# Patient Record
Sex: Male | Born: 1950
Health system: Southern US, Community
[De-identification: ages and names within clinical notes are randomized; demographics above are authoritative.]

## PROBLEM LIST (undated history)

## (undated) DIAGNOSIS — M1990 Unspecified osteoarthritis, unspecified site: Secondary | ICD-10-CM

## (undated) DIAGNOSIS — J40 Bronchitis, not specified as acute or chronic: Secondary | ICD-10-CM

## (undated) DIAGNOSIS — K219 Gastro-esophageal reflux disease without esophagitis: Secondary | ICD-10-CM

## (undated) DIAGNOSIS — E1169 Type 2 diabetes mellitus with other specified complication: Secondary | ICD-10-CM

## (undated) DIAGNOSIS — Z87442 Personal history of urinary calculi: Secondary | ICD-10-CM

## (undated) DIAGNOSIS — Z6828 Body mass index (BMI) 28.0-28.9, adult: Secondary | ICD-10-CM

## (undated) DIAGNOSIS — M14679 Charcot's joint, unspecified ankle and foot: Secondary | ICD-10-CM

## (undated) DIAGNOSIS — E11621 Type 2 diabetes mellitus with foot ulcer: Secondary | ICD-10-CM

## (undated) DIAGNOSIS — E119 Type 2 diabetes mellitus without complications: Secondary | ICD-10-CM

## (undated) DIAGNOSIS — J329 Chronic sinusitis, unspecified: Secondary | ICD-10-CM

## (undated) DIAGNOSIS — R000 Tachycardia, unspecified: Secondary | ICD-10-CM

## (undated) DIAGNOSIS — J45909 Unspecified asthma, uncomplicated: Secondary | ICD-10-CM

## (undated) HISTORY — DX: Unspecified asthma, uncomplicated: J45.909

## (undated) HISTORY — DX: Type 2 diabetes mellitus with foot ulcer: E11.621

## (undated) HISTORY — DX: Body mass index (BMI) 28.0-28.9, adult: Z68.28

## (undated) HISTORY — DX: Chronic sinusitis, unspecified: J32.9

## (undated) HISTORY — DX: Personal history of urinary calculi: Z87.442

## (undated) HISTORY — DX: Type 2 diabetes mellitus with other specified complication: E11.69

## (undated) HISTORY — DX: Tachycardia, unspecified: R00.0

## (undated) HISTORY — DX: Gastro-esophageal reflux disease without esophagitis: K21.9

## (undated) HISTORY — PX: SINOSCOPY: SHX187

---
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
## 1981-10-12 HISTORY — PX: CERVICAL DISC SURGERY: SHX588

## 2006-12-16 ENCOUNTER — Encounter: Admission: RE | Admit: 2006-12-16 | Discharge: 2006-12-16 | Payer: Self-pay | Admitting: Unknown Physician Specialty

## 2014-08-12 HISTORY — PX: FINGER NAIL SURGERY: SHX717

## 2014-11-12 DIAGNOSIS — M14679 Charcot's joint, unspecified ankle and foot: Secondary | ICD-10-CM | POA: Insufficient documentation

## 2014-11-12 HISTORY — DX: Charcot's joint, unspecified ankle and foot: M14.679

## 2015-07-23 ENCOUNTER — Other Ambulatory Visit: Payer: Self-pay | Admitting: Orthopedic Surgery

## 2015-08-16 DIAGNOSIS — M25562 Pain in left knee: Secondary | ICD-10-CM

## 2015-08-16 DIAGNOSIS — M1712 Unilateral primary osteoarthritis, left knee: Secondary | ICD-10-CM

## 2015-08-16 DIAGNOSIS — G8929 Other chronic pain: Secondary | ICD-10-CM

## 2015-08-16 HISTORY — DX: Pain in left knee: M25.562

## 2015-08-16 HISTORY — DX: Unilateral primary osteoarthritis, left knee: M17.12

## 2015-08-16 HISTORY — DX: Other chronic pain: G89.29

## 2015-08-16 NOTE — Pre-Procedure Instructions (Signed)
John Miranda  08/16/2015      [id number] - [place], [place] - [address] [place] [address] Phone: [phone] Fax: [phone]    Your procedure is scheduled on Mon, Nov 21 @ 7:30 AM  Report to [facility] Admitting at 5:30 AM  Call this number if you have problems the morning of surgery:  [phone]   Remember:  Do not eat food or drink liquids after midnight.             The morning of surgery with a sip of water take: no medications                  How to Manage Your Diabetes Before Surgery   Why is it important to control my blood sugar before and after surgery?   Improving blood sugar levels before and after surgery helps healing and can limit problems.  A way of improving blood sugar control is eating a healthy diet by:  - Eating less sugar and carbohydrates  - Increasing activity/exercise  - Talk with your doctor about reaching your blood sugar goals  High blood sugars (greater than 180 mg/dL) can raise your risk of infections and slow down your recovery so you will need to focus on controlling your diabetes during the weeks before surgery.  Make sure that the doctor who takes care of your diabetes knows about your planned surgery including the date and location.  How do I manage my blood sugars before surgery?   Check your blood sugar at least 4 times a day, 2 days before surgery to make sure that they are not too high or low.   Check your blood sugar the morning of your surgery when you wake up and every 2               hours until you get to the Short-Stay unit.  If your blood sugar is less than 70 mg/dL, you will need to treat for low blood sugar by:  Treat a low blood sugar (less than 70 mg/dL) with 1/2 cup of clear juice (cranberry or apple), 4 glucose tablets, OR glucose gel.  Recheck blood sugar in 15 minutes after treatment (to make sure it is greater than 70 mg/dL).  If blood sugar  is not greater than 70 mg/dL on re-check, call [phone] for further instructions.   Report your blood sugar to the Short-Stay nurse when you get to Short-Stay.  References:  [facility], 2007 "How to Manage your Diabetes Before and After Surgery".  What do I do about my diabetes medications?   Do not take oral diabetes medicines (pills) the morning of surgery.                No Goody's,BC's,Aleve,Aspirin,Ibuprofen,Motrin,Advil,Fish Oil,or any Herbal Medications.    Do not wear jewelry.  Do not wear lotions, powders, or colognes.  You may wear deodorant.             Men may shave face and neck.  Do not bring valuables to the hospital.  [facility] is not responsible for any belongings or valuables.  Contacts, dentures or bridgework may not be worn into surgery.  Leave your suitcase in the car.  After surgery it may be brought to your room.  For patients admitted to the hospital, discharge time will be determined by your treatment team.  Patients discharged the  day of surgery will not be allowed to drive home.    Special instructions:    Please read over the following fact sheets that you were given. Pain Booklet, Coughing and Deep Breathing, MRSA Information and Surgical Site Infection Prevention

## 2015-08-19 ENCOUNTER — Ambulatory Visit (HOSPITAL_COMMUNITY)
Admission: RE | Admit: 2015-08-19 | Discharge: 2015-08-19 | Disposition: A | Discharge status: Home / Self Care | Payer: BLUE CROSS/BLUE SHIELD | Source: Ambulatory Visit | Attending: Orthopedic Surgery | Admitting: Orthopedic Surgery

## 2015-08-19 ENCOUNTER — Encounter (HOSPITAL_COMMUNITY): Payer: Self-pay

## 2015-08-19 ENCOUNTER — Encounter (HOSPITAL_COMMUNITY)
Admission: RE | Admit: 2015-08-19 | Discharge: 2015-08-19 | Disposition: A | Discharge status: Home / Self Care | Payer: BLUE CROSS/BLUE SHIELD | Source: Ambulatory Visit | Attending: Orthopedic Surgery | Admitting: Orthopedic Surgery

## 2015-08-19 DIAGNOSIS — Z01818 Encounter for other preprocedural examination: Secondary | ICD-10-CM

## 2015-08-19 DIAGNOSIS — Z01812 Encounter for preprocedural laboratory examination: Secondary | ICD-10-CM | POA: Insufficient documentation

## 2015-08-19 DIAGNOSIS — E119 Type 2 diabetes mellitus without complications: Secondary | ICD-10-CM | POA: Insufficient documentation

## 2015-08-19 DIAGNOSIS — R918 Other nonspecific abnormal finding of lung field: Secondary | ICD-10-CM | POA: Diagnosis not present

## 2015-08-19 HISTORY — DX: Unspecified osteoarthritis, unspecified site: M19.90

## 2015-08-19 HISTORY — DX: Type 2 diabetes mellitus without complications: E11.9

## 2015-08-19 HISTORY — DX: Charcot's joint, unspecified ankle and foot: M14.679

## 2015-08-19 LAB — CBC WITH DIFFERENTIAL/PLATELET
BASOS ABS: 0 10*3/uL (ref 0.0–0.1)
BASOS PCT: 0 %
EOS ABS: 0.2 10*3/uL (ref 0.0–0.7)
Eosinophils Relative: 2 %
HCT: 46.6 % (ref 39.0–52.0)
Hemoglobin: 15.7 g/dL (ref 13.0–17.0)
Lymphocytes Relative: 18 %
Lymphs Abs: 1.6 10*3/uL (ref 0.7–4.0)
MCH: 31.8 pg (ref 26.0–34.0)
MCHC: 33.7 g/dL (ref 30.0–36.0)
MCV: 94.5 fL (ref 78.0–100.0)
MONO ABS: 0.6 10*3/uL (ref 0.1–1.0)
MONOS PCT: 7 %
NEUTROS PCT: 73 %
Neutro Abs: 6.2 10*3/uL (ref 1.7–7.7)
Platelets: 199 10*3/uL (ref 150–400)
RBC: 4.93 MIL/uL (ref 4.22–5.81)
RDW: 14.1 % (ref 11.5–15.5)
WBC: 8.6 10*3/uL (ref 4.0–10.5)

## 2015-08-19 LAB — URINALYSIS, ROUTINE W REFLEX MICROSCOPIC
Bilirubin Urine: NEGATIVE
Glucose, UA: NEGATIVE mg/dL
HGB URINE DIPSTICK: NEGATIVE
Ketones, ur: NEGATIVE mg/dL
LEUKOCYTES UA: NEGATIVE
NITRITE: NEGATIVE
PROTEIN: NEGATIVE mg/dL
Specific Gravity, Urine: 1.019 (ref 1.005–1.030)
UROBILINOGEN UA: 0.2 mg/dL (ref 0.0–1.0)
pH: 5 (ref 5.0–8.0)

## 2015-08-19 LAB — COMPREHENSIVE METABOLIC PANEL
ALBUMIN: 4.3 g/dL (ref 3.5–5.0)
ALT: 22 U/L (ref 17–63)
ANION GAP: 8 (ref 5–15)
AST: 21 U/L (ref 15–41)
Alkaline Phosphatase: 65 U/L (ref 38–126)
BUN: 18 mg/dL (ref 6–20)
CHLORIDE: 104 mmol/L (ref 101–111)
CO2: 28 mmol/L (ref 22–32)
Calcium: 9.8 mg/dL (ref 8.9–10.3)
Creatinine, Ser: 0.83 mg/dL (ref 0.61–1.24)
GFR calc Af Amer: >60 mL/min (ref >60)
GFR calc non Af Amer: >60 mL/min (ref >60)
GLUCOSE: 134 mg/dL — AB (ref 65–99)
POTASSIUM: 4.2 mmol/L (ref 3.5–5.1)
SODIUM: 140 mmol/L (ref 135–145)
Total Bilirubin: 0.7 mg/dL (ref 0.3–1.2)
Total Protein: 7.9 g/dL (ref 6.5–8.1)

## 2015-08-19 LAB — SURGICAL PCR SCREEN
MRSA, PCR: NEGATIVE
STAPHYLOCOCCUS AUREUS: NEGATIVE

## 2015-08-19 LAB — APTT: APTT: 31 s (ref 24–37)

## 2015-08-19 LAB — PROTIME-INR
INR: 1.19 (ref 0.00–1.49)
Prothrombin Time: 15.2 seconds (ref 11.6–15.2)

## 2015-08-19 LAB — GLUCOSE, CAPILLARY: Glucose-Capillary: 134 mg/dL — ABNORMAL HIGH (ref 65–99)

## 2015-08-19 NOTE — Progress Notes (Signed)
PCP: Dr. Humphrey Rolls: [facility], will request hgb A1C Denies cardiologist [facility]: ekg 2016-will request  Pt. On enalipril to protect kidneys, denies high blood pressure

## 2015-08-20 ENCOUNTER — Other Ambulatory Visit (HOSPITAL_COMMUNITY): Payer: Self-pay

## 2015-08-20 LAB — URINE CULTURE: Culture: NO GROWTH

## 2015-08-30 MED ORDER — CEFAZOLIN SODIUM-DEXTROSE 2-3 GM-% IV SOLR
2.0000 g | INTRAVENOUS | Status: AC
  Administered 2015-09-02: 2 g via INTRAVENOUS
  Filled 2015-08-30: qty 50

## 2015-08-30 MED ORDER — CHLORHEXIDINE GLUCONATE 4 % EX LIQD: 60.0000 mL | Freq: Once | CUTANEOUS | Status: DC

## 2015-08-30 MED ORDER — TRANEXAMIC ACID 1000 MG/10ML IV SOLN
1000.0000 mg | INTRAVENOUS | Status: AC
  Administered 2015-09-02: 1000 mg via INTRAVENOUS
  Filled 2015-08-30: qty 10

## 2015-08-30 MED ORDER — SODIUM CHLORIDE 0.9 % IV SOLN: INTRAVENOUS | Status: DC

## 2015-08-30 MED ORDER — BUPIVACAINE LIPOSOME 1.3 % IJ SUSP
20.0000 mL | Freq: Once | INTRAMUSCULAR | Status: AC
  Administered 2015-09-02: 20 mL
  Filled 2015-08-30: qty 20

## 2015-09-02 ENCOUNTER — Inpatient Hospital Stay (HOSPITAL_COMMUNITY)
Admission: RE | Admit: 2015-09-02 | Discharge: 2015-09-03 | DRG: 470 | Disposition: A | Discharge status: Home Health | Payer: BLUE CROSS/BLUE SHIELD | Source: Ambulatory Visit | Attending: Orthopedic Surgery | Admitting: Orthopedic Surgery

## 2015-09-02 ENCOUNTER — Inpatient Hospital Stay (HOSPITAL_COMMUNITY): Payer: BLUE CROSS/BLUE SHIELD | Admitting: Anesthesiology

## 2015-09-02 ENCOUNTER — Encounter (HOSPITAL_COMMUNITY): Payer: Self-pay | Admitting: *Deleted

## 2015-09-02 ENCOUNTER — Encounter (HOSPITAL_COMMUNITY)
Admission: RE | Disposition: A | Discharge status: Home Health | Payer: Self-pay | Source: Ambulatory Visit | Attending: Orthopedic Surgery

## 2015-09-02 DIAGNOSIS — M1712 Unilateral primary osteoarthritis, left knee: Principal | ICD-10-CM | POA: Diagnosis present

## 2015-09-02 DIAGNOSIS — Z96652 Presence of left artificial knee joint: Secondary | ICD-10-CM

## 2015-09-02 DIAGNOSIS — Z7984 Long term (current) use of oral hypoglycemic drugs: Secondary | ICD-10-CM | POA: Diagnosis not present

## 2015-09-02 DIAGNOSIS — E11610 Type 2 diabetes mellitus with diabetic neuropathic arthropathy: Secondary | ICD-10-CM | POA: Diagnosis present

## 2015-09-02 DIAGNOSIS — D62 Acute posthemorrhagic anemia: Secondary | ICD-10-CM | POA: Diagnosis not present

## 2015-09-02 DIAGNOSIS — Z79899 Other long term (current) drug therapy: Secondary | ICD-10-CM

## 2015-09-02 DIAGNOSIS — Z96659 Presence of unspecified artificial knee joint: Secondary | ICD-10-CM

## 2015-09-02 DIAGNOSIS — M25562 Pain in left knee: Secondary | ICD-10-CM | POA: Diagnosis present

## 2015-09-02 HISTORY — PX: TOTAL KNEE ARTHROPLASTY: SHX125

## 2015-09-02 HISTORY — DX: Presence of left artificial knee joint: Z96.652

## 2015-09-02 LAB — CBC
HEMATOCRIT: 39.5 % (ref 39.0–52.0)
Hemoglobin: 13.4 g/dL (ref 13.0–17.0)
MCH: 32.1 pg (ref 26.0–34.0)
MCHC: 33.9 g/dL (ref 30.0–36.0)
MCV: 94.5 fL (ref 78.0–100.0)
Platelets: 184 10*3/uL (ref 150–400)
RBC: 4.18 MIL/uL — AB (ref 4.22–5.81)
RDW: 13.9 % (ref 11.5–15.5)
WBC: 13.4 10*3/uL — AB (ref 4.0–10.5)

## 2015-09-02 LAB — GLUCOSE, CAPILLARY
GLUCOSE-CAPILLARY: 95 mg/dL (ref 65–99)
GLUCOSE-CAPILLARY: 162 mg/dL — AB (ref 65–99)
Glucose-Capillary: 116 mg/dL — ABNORMAL HIGH (ref 65–99)
Glucose-Capillary: 171 mg/dL — ABNORMAL HIGH (ref 65–99)

## 2015-09-02 LAB — CREATININE, SERUM: Creatinine, Ser: 0.94 mg/dL (ref 0.61–1.24)

## 2015-09-02 SURGERY — ARTHROPLASTY, KNEE, TOTAL
Anesthesia: Monitor Anesthesia Care | Site: Knee | Laterality: Left

## 2015-09-02 MED ORDER — GLYCOPYRROLATE 0.2 MG/ML IJ SOLN
INTRAMUSCULAR | Status: AC
  Filled 2015-09-02: qty 1

## 2015-09-02 MED ORDER — FLEET ENEMA 7-19 GM/118ML RE ENEM: 1.0000 | ENEMA | Freq: Once | RECTAL | Status: DC | PRN

## 2015-09-02 MED ORDER — 0.9 % SODIUM CHLORIDE (POUR BTL) OPTIME
TOPICAL | Status: DC | PRN
  Administered 2015-09-02: 1000 mL

## 2015-09-02 MED ORDER — ACETAMINOPHEN 160 MG/5ML PO SOLN
325.0000 mg | ORAL | Status: DC | PRN
  Filled 2015-09-02: qty 20.3

## 2015-09-02 MED ORDER — METOCLOPRAMIDE HCL 5 MG PO TABS: 5.0000 mg | ORAL_TABLET | Freq: Three times a day (TID) | ORAL | Status: DC | PRN

## 2015-09-02 MED ORDER — ZOLPIDEM TARTRATE 5 MG PO TABS: 5.0000 mg | ORAL_TABLET | Freq: Every evening | ORAL | Status: DC | PRN

## 2015-09-02 MED ORDER — ONDANSETRON HCL 4 MG/2ML IJ SOLN: 4.0000 mg | Freq: Four times a day (QID) | INTRAMUSCULAR | Status: DC | PRN

## 2015-09-02 MED ORDER — BUPIVACAINE-EPINEPHRINE 0.5% -1:200000 IJ SOLN
INTRAMUSCULAR | Status: DC | PRN
  Administered 2015-09-02: 30 mL

## 2015-09-02 MED ORDER — MIDAZOLAM HCL 5 MG/5ML IJ SOLN
INTRAMUSCULAR | Status: DC | PRN
  Administered 2015-09-02: 2 mg via INTRAVENOUS

## 2015-09-02 MED ORDER — PHENOL 1.4 % MT LIQD: 1.0000 | OROMUCOSAL | Status: DC | PRN

## 2015-09-02 MED ORDER — LIDOCAINE HCL (CARDIAC) 20 MG/ML IV SOLN
INTRAVENOUS | Status: AC
  Filled 2015-09-02: qty 5

## 2015-09-02 MED ORDER — ACETAMINOPHEN 325 MG PO TABS: 325.0000 mg | ORAL_TABLET | ORAL | Status: DC | PRN

## 2015-09-02 MED ORDER — OXYCODONE HCL ER 10 MG PO T12A
10.0000 mg | EXTENDED_RELEASE_TABLET | Freq: Two times a day (BID) | ORAL | Status: DC
  Administered 2015-09-02: 10 mg via ORAL
  Filled 2015-09-02 (×2): qty 1

## 2015-09-02 MED ORDER — SUCCINYLCHOLINE CHLORIDE 20 MG/ML IJ SOLN
INTRAMUSCULAR | Status: AC
  Filled 2015-09-02: qty 1

## 2015-09-02 MED ORDER — ROCURONIUM BROMIDE 50 MG/5ML IV SOLN
INTRAVENOUS | Status: AC
  Filled 2015-09-02: qty 1

## 2015-09-02 MED ORDER — INSULIN ASPART 100 UNIT/ML ~~LOC~~ SOLN
0.0000 [IU] | Freq: Three times a day (TID) | SUBCUTANEOUS | Status: DC
  Administered 2015-09-02 – 2015-09-03 (×2): 2 [IU] via SUBCUTANEOUS
  Administered 2015-09-03: 3 [IU] via SUBCUTANEOUS

## 2015-09-02 MED ORDER — CELECOXIB 200 MG PO CAPS
200.0000 mg | ORAL_CAPSULE | Freq: Two times a day (BID) | ORAL | Status: DC
  Administered 2015-09-02 – 2015-09-03 (×2): 200 mg via ORAL
  Filled 2015-09-02 (×2): qty 1

## 2015-09-02 MED ORDER — SODIUM CHLORIDE 0.9 % IJ SOLN
INTRAMUSCULAR | Status: DC | PRN
  Administered 2015-09-02: 20 mL

## 2015-09-02 MED ORDER — OXYCODONE HCL 5 MG PO TABS
5.0000 mg | ORAL_TABLET | ORAL | Status: DC | PRN
  Administered 2015-09-02 (×4): 10 mg via ORAL
  Administered 2015-09-03: 5 mg via ORAL
  Administered 2015-09-03 (×2): 10 mg via ORAL
  Filled 2015-09-02 (×3): qty 2
  Filled 2015-09-02: qty 1
  Filled 2015-09-02 (×2): qty 2

## 2015-09-02 MED ORDER — TRANEXAMIC ACID 1000 MG/10ML IV SOLN
1000.0000 mg | Freq: Once | INTRAVENOUS | Status: DC
  Filled 2015-09-02 (×2): qty 10

## 2015-09-02 MED ORDER — BUPIVACAINE IN DEXTROSE 0.75-8.25 % IT SOLN
INTRATHECAL | Status: DC | PRN
  Administered 2015-09-02: 2 mL via INTRATHECAL

## 2015-09-02 MED ORDER — SENNOSIDES-DOCUSATE SODIUM 8.6-50 MG PO TABS: 1.0000 | ORAL_TABLET | Freq: Every evening | ORAL | Status: DC | PRN

## 2015-09-02 MED ORDER — SITAGLIP PHOS-METFORMIN HCL ER 50-1000 MG PO TB24: 2.0000 | ORAL_TABLET | Freq: Every evening | ORAL | Status: DC

## 2015-09-02 MED ORDER — ENOXAPARIN SODIUM 30 MG/0.3ML ~~LOC~~ SOLN
30.0000 mg | Freq: Two times a day (BID) | SUBCUTANEOUS | Status: DC
  Administered 2015-09-03: 30 mg via SUBCUTANEOUS
  Filled 2015-09-02: qty 0.3

## 2015-09-02 MED ORDER — FENTANYL CITRATE (PF) 250 MCG/5ML IJ SOLN
INTRAMUSCULAR | Status: AC
  Filled 2015-09-02: qty 5

## 2015-09-02 MED ORDER — ACETAMINOPHEN 650 MG RE SUPP: 650.0000 mg | Freq: Four times a day (QID) | RECTAL | Status: DC | PRN

## 2015-09-02 MED ORDER — ACETAMINOPHEN 325 MG PO TABS: 650.0000 mg | ORAL_TABLET | Freq: Four times a day (QID) | ORAL | Status: DC | PRN

## 2015-09-02 MED ORDER — CEFAZOLIN SODIUM-DEXTROSE 2-3 GM-% IV SOLR
2.0000 g | Freq: Four times a day (QID) | INTRAVENOUS | Status: AC
  Administered 2015-09-02 (×2): 2 g via INTRAVENOUS
  Filled 2015-09-02 (×2): qty 50

## 2015-09-02 MED ORDER — MIDAZOLAM HCL 2 MG/2ML IJ SOLN
INTRAMUSCULAR | Status: AC
  Filled 2015-09-02: qty 2

## 2015-09-02 MED ORDER — METHOCARBAMOL 500 MG PO TABS
500.0000 mg | ORAL_TABLET | Freq: Four times a day (QID) | ORAL | Status: DC | PRN
  Administered 2015-09-02 – 2015-09-03 (×2): 500 mg via ORAL
  Filled 2015-09-02 (×2): qty 1

## 2015-09-02 MED ORDER — METFORMIN HCL ER 500 MG PO TB24
2000.0000 mg | ORAL_TABLET | Freq: Every day | ORAL | Status: DC
  Administered 2015-09-02: 2000 mg via ORAL
  Filled 2015-09-02: qty 4

## 2015-09-02 MED ORDER — BUPIVACAINE LIPOSOME 1.3 % IJ SUSP
20.0000 mL | INTRAMUSCULAR | Status: DC
Start: 2015-09-02 — End: 2015-09-02
  Filled 2015-09-02: qty 20

## 2015-09-02 MED ORDER — FENTANYL CITRATE (PF) 100 MCG/2ML IJ SOLN
INTRAMUSCULAR | Status: DC | PRN
  Administered 2015-09-02 (×3): 50 ug via INTRAVENOUS

## 2015-09-02 MED ORDER — HYDROMORPHONE HCL 1 MG/ML IJ SOLN: 0.2500 mg | INTRAMUSCULAR | Status: DC | PRN

## 2015-09-02 MED ORDER — PROPOFOL 10 MG/ML IV BOLUS
INTRAVENOUS | Status: DC | PRN
  Administered 2015-09-02 (×2): 20 mg via INTRAVENOUS

## 2015-09-02 MED ORDER — SODIUM CHLORIDE 0.9 % IV SOLN
INTRAVENOUS | Status: DC
  Administered 2015-09-02 (×2): via INTRAVENOUS

## 2015-09-02 MED ORDER — BISACODYL 5 MG PO TBEC: 5.0000 mg | DELAYED_RELEASE_TABLET | Freq: Every day | ORAL | Status: DC | PRN

## 2015-09-02 MED ORDER — BUPIVACAINE-EPINEPHRINE (PF) 0.5% -1:200000 IJ SOLN
INTRAMUSCULAR | Status: AC
  Filled 2015-09-02: qty 30

## 2015-09-02 MED ORDER — GLIMEPIRIDE 4 MG PO TABS
4.0000 mg | ORAL_TABLET | ORAL | Status: DC
  Administered 2015-09-02 – 2015-09-03 (×2): 4 mg via ORAL
  Filled 2015-09-02 (×2): qty 1

## 2015-09-02 MED ORDER — ENALAPRIL MALEATE 10 MG PO TABS
10.0000 mg | ORAL_TABLET | Freq: Every day | ORAL | Status: DC
  Administered 2015-09-02 – 2015-09-03 (×2): 10 mg via ORAL
  Filled 2015-09-02 (×3): qty 1

## 2015-09-02 MED ORDER — ALUM & MAG HYDROXIDE-SIMETH 200-200-20 MG/5ML PO SUSP: 30.0000 mL | ORAL | Status: DC | PRN

## 2015-09-02 MED ORDER — SODIUM CHLORIDE 0.9 % IJ SOLN
INTRAMUSCULAR | Status: AC
  Filled 2015-09-02: qty 10

## 2015-09-02 MED ORDER — METOCLOPRAMIDE HCL 5 MG/ML IJ SOLN: 5.0000 mg | Freq: Three times a day (TID) | INTRAMUSCULAR | Status: DC | PRN

## 2015-09-02 MED ORDER — METFORMIN HCL 500 MG PO TABS: 2000.0000 mg | ORAL_TABLET | Freq: Every day | ORAL | Status: DC

## 2015-09-02 MED ORDER — MENTHOL 3 MG MT LOZG: 1.0000 | LOZENGE | OROMUCOSAL | Status: DC | PRN

## 2015-09-02 MED ORDER — METHOCARBAMOL 1000 MG/10ML IJ SOLN: 500.0000 mg | Freq: Four times a day (QID) | INTRAVENOUS | Status: DC | PRN

## 2015-09-02 MED ORDER — EPHEDRINE SULFATE 50 MG/ML IJ SOLN
INTRAMUSCULAR | Status: AC
  Filled 2015-09-02: qty 1

## 2015-09-02 MED ORDER — LINAGLIPTIN 5 MG PO TABS
5.0000 mg | ORAL_TABLET | Freq: Every day | ORAL | Status: DC
  Administered 2015-09-02: 5 mg via ORAL
  Filled 2015-09-02: qty 1

## 2015-09-02 MED ORDER — DIPHENHYDRAMINE HCL 12.5 MG/5ML PO ELIX: 12.5000 mg | ORAL_SOLUTION | ORAL | Status: DC | PRN

## 2015-09-02 MED ORDER — OXYCODONE HCL 5 MG/5ML PO SOLN: 5.0000 mg | Freq: Once | ORAL | Status: DC | PRN

## 2015-09-02 MED ORDER — LACTATED RINGERS IV SOLN
INTRAVENOUS | Status: DC | PRN
  Administered 2015-09-02: 07:00:00 via INTRAVENOUS

## 2015-09-02 MED ORDER — PROPOFOL 500 MG/50ML IV EMUL
INTRAVENOUS | Status: DC | PRN
  Administered 2015-09-02: 35 ug/kg/min via INTRAVENOUS

## 2015-09-02 MED ORDER — OXYCODONE HCL 5 MG PO TABS: 5.0000 mg | ORAL_TABLET | Freq: Once | ORAL | Status: DC | PRN

## 2015-09-02 MED ORDER — ONDANSETRON HCL 4 MG/2ML IJ SOLN
INTRAMUSCULAR | Status: DC | PRN
  Administered 2015-09-02: 4 mg via INTRAVENOUS

## 2015-09-02 MED ORDER — DOCUSATE SODIUM 100 MG PO CAPS
100.0000 mg | ORAL_CAPSULE | Freq: Two times a day (BID) | ORAL | Status: DC
  Administered 2015-09-02 – 2015-09-03 (×2): 100 mg via ORAL
  Filled 2015-09-02 (×2): qty 1

## 2015-09-02 MED ORDER — SODIUM CHLORIDE 0.9 % IR SOLN
Status: DC | PRN
  Administered 2015-09-02: 3000 mL

## 2015-09-02 MED ORDER — HYDROMORPHONE HCL 1 MG/ML IJ SOLN
1.0000 mg | INTRAMUSCULAR | Status: DC | PRN
  Administered 2015-09-02: 1 mg via INTRAVENOUS
  Filled 2015-09-02: qty 1

## 2015-09-02 MED ORDER — ONDANSETRON HCL 4 MG PO TABS
4.0000 mg | ORAL_TABLET | Freq: Four times a day (QID) | ORAL | Status: DC | PRN
  Administered 2015-09-03: 4 mg via ORAL
  Filled 2015-09-02: qty 1

## 2015-09-02 MED ORDER — LINAGLIPTIN 5 MG PO TABS: 5.0000 mg | ORAL_TABLET | Freq: Every day | ORAL | Status: DC

## 2015-09-02 SURGICAL SUPPLY — 56 items
ARTISURF 11M PLY L 6-9EF KNEE (Knees) ×2 IMPLANT
BANDAGE ESMARK 6X9 LF (GAUZE/BANDAGES/DRESSINGS) ×1 IMPLANT
BLADE SAGITTAL 13X1.27X60 (BLADE) ×2 IMPLANT
BLADE SAW SGTL 83.5X18.5 (BLADE) ×2 IMPLANT
BLADE SURG 10 STRL SS (BLADE) ×2 IMPLANT
BNDG ESMARK 6X9 LF (GAUZE/BANDAGES/DRESSINGS) ×2
BOWL SMART MIX CTS (DISPOSABLE) ×2 IMPLANT
CAPT KNEE TOTAL 3 ×2 IMPLANT
CEMENT BONE SIMPLEX SPEEDSET (Cement) ×4 IMPLANT
COVER SURGICAL LIGHT HANDLE (MISCELLANEOUS) ×2 IMPLANT
CUFF TOURNIQUET SINGLE 34IN LL (TOURNIQUET CUFF) ×2 IMPLANT
DRAPE EXTREMITY T 121X128X90 (DRAPE) ×2 IMPLANT
DRAPE INCISE IOBAN 66X45 STRL (DRAPES) ×4 IMPLANT
DRAPE PROXIMA HALF (DRAPES) IMPLANT
DRAPE U-SHAPE 47X51 STRL (DRAPES) ×2 IMPLANT
DRSG ADAPTIC 3X8 NADH LF (GAUZE/BANDAGES/DRESSINGS) ×2 IMPLANT
DRSG PAD ABDOMINAL 8X10 ST (GAUZE/BANDAGES/DRESSINGS) ×2 IMPLANT
DURAPREP 26ML APPLICATOR (WOUND CARE) ×4 IMPLANT
ELECT REM PT RETURN 9FT ADLT (ELECTROSURGICAL) ×2
ELECTRODE REM PT RTRN 9FT ADLT (ELECTROSURGICAL) ×1 IMPLANT
GAUZE SPONGE 4X4 12PLY STRL (GAUZE/BANDAGES/DRESSINGS) ×2 IMPLANT
GLOVE BIOGEL M 7.0 STRL (GLOVE) IMPLANT
GLOVE BIOGEL PI IND STRL 7.5 (GLOVE) IMPLANT
GLOVE BIOGEL PI IND STRL 8.5 (GLOVE) ×2 IMPLANT
GLOVE BIOGEL PI INDICATOR 7.5 (GLOVE)
GLOVE BIOGEL PI INDICATOR 8.5 (GLOVE) ×2
GLOVE SURG ORTHO 8.0 STRL STRW (GLOVE) ×4 IMPLANT
GOWN STRL REUS W/ TWL LRG LVL3 (GOWN DISPOSABLE) ×1 IMPLANT
GOWN STRL REUS W/ TWL XL LVL3 (GOWN DISPOSABLE) ×2 IMPLANT
GOWN STRL REUS W/TWL LRG LVL3 (GOWN DISPOSABLE) ×1
GOWN STRL REUS W/TWL XL LVL3 (GOWN DISPOSABLE) ×2
HANDPIECE INTERPULSE COAX TIP (DISPOSABLE) ×1
HOOD PEEL AWAY FACE SHEILD DIS (HOOD) ×6 IMPLANT
KIT BASIN OR (CUSTOM PROCEDURE TRAY) ×2 IMPLANT
KIT ROOM TURNOVER OR (KITS) ×2 IMPLANT
KNEE CAPITATED TOTAL 3 ×1 IMPLANT
MANIFOLD NEPTUNE II (INSTRUMENTS) ×2 IMPLANT
NEEDLE 22X1 1/2 (OR ONLY) (NEEDLE) ×4 IMPLANT
NS IRRIG 1000ML POUR BTL (IV SOLUTION) ×2 IMPLANT
PACK TOTAL JOINT (CUSTOM PROCEDURE TRAY) ×2 IMPLANT
PACK UNIVERSAL I (CUSTOM PROCEDURE TRAY) ×2 IMPLANT
PAD ARMBOARD 7.5X6 YLW CONV (MISCELLANEOUS) ×4 IMPLANT
PADDING CAST COTTON 6X4 STRL (CAST SUPPLIES) ×2 IMPLANT
SET HNDPC FAN SPRY TIP SCT (DISPOSABLE) ×1 IMPLANT
STAPLER VISISTAT 35W (STAPLE) ×2 IMPLANT
SUCTION FRAZIER TIP 10 FR DISP (SUCTIONS) ×2 IMPLANT
SUT BONE WAX W31G (SUTURE) ×2 IMPLANT
SUT VIC AB 0 CTB1 27 (SUTURE) ×4 IMPLANT
SUT VIC AB 1 CT1 27 (SUTURE) ×2
SUT VIC AB 1 CT1 27XBRD ANBCTR (SUTURE) ×2 IMPLANT
SUT VIC AB 2-0 CT1 27 (SUTURE) ×2
SUT VIC AB 2-0 CT1 TAPERPNT 27 (SUTURE) ×2 IMPLANT
SYR 20CC LL (SYRINGE) ×4 IMPLANT
TOWEL OR 17X24 6PK STRL BLUE (TOWEL DISPOSABLE) ×2 IMPLANT
TOWEL OR 17X26 10 PK STRL BLUE (TOWEL DISPOSABLE) ×2 IMPLANT
WATER STERILE IRR 1000ML POUR (IV SOLUTION) ×4 IMPLANT

## 2015-09-02 NOTE — H&P (Signed)
John Miranda MRN:  [id number] DOB/SEX:  1951-04-16/male  CHIEF COMPLAINT:  Painful left Knee  HISTORY: Patient is a 64 y.o. male presented with a history of pain in the left knee. Onset of symptoms was gradual starting several years ago with gradually worsening course since that time. Prior procedures on the knee include arthroplasty. Patient has been treated conservatively with over-the-counter NSAIDs and activity modification. Patient currently rates pain in the knee at 9 out of 10 with activity. There is pain at night.  PAST MEDICAL HISTORY: There are no active problems to display for this patient.  Past Medical History  Diagnosis Date  . Diabetes mellitus without complication ([place])   . Arthritis   . Charcot's joint of foot 11/2014    right foot   Past Surgical History  Procedure Laterality Date  . Finger nail surgery Right 08/2014    index finger-local  . Cervical disc surgery  1983     MEDICATIONS:   Prescriptions prior to admission  Medication Sig Dispense Refill Last Dose  . enalapril (VASOTEC) 10 MG tablet Take 10 mg by mouth daily.  2 09/01/2015 at Unknown time  . glimepiride (AMARYL) 4 MG tablet Take 1 tablet by mouth every morning.  6 09/01/2015 at Unknown time  . JANUMET XR 50-1000 MG TB24 Take 2 tablets by mouth every evening.  4 09/01/2015 at Unknown time  . Misc Natural Products (GLUCOSAMINE CHONDROITIN MSM PO) Take 1 tablet by mouth 2 (two) times daily.   1 week    ALLERGIES:  No Known Allergies  REVIEW OF SYSTEMS:  Pertinent items noted in HPI and remainder of comprehensive ROS otherwise negative.   FAMILY HISTORY:  History reviewed. No pertinent family history.  SOCIAL HISTORY:   Social History  Substance Use Topics  . Smoking status: Never Smoker   . Smokeless tobacco: Not on file  . Alcohol Use: No     EXAMINATION:  Vital signs in last 24 hours: Temp:  [98.5 F (36.9 C)] 98.5 F (36.9 C) (11/21 0558) Pulse Rate:  [74] 74 (11/21 0558) Resp:   [20] 20 (11/21 0558) BP: (143)/(71) 143/71 mmHg (11/21 0558) SpO2:  [97 %] 97 % (11/21 0558) Weight:  [93.305 kg (205 lb 11.2 oz)] 93.305 kg (205 lb 11.2 oz) (11/21 0558)  General appearance: alert, cooperative and no distress Lungs: clear to auscultation bilaterally Heart: regular rate and rhythm, S1, S2 normal, no murmur, click, rub or gallop Abdomen: soft, non-tender; bowel sounds normal; no masses,  no organomegaly Extremities: extremities normal, atraumatic, no cyanosis or edema and Homans sign is negative, no sign of DVT Pulses: 2+ and symmetric Skin: Skin color, texture, turgor normal. No rashes or lesions Neurologic: Alert and oriented X 3, normal strength and tone. Normal symmetric reflexes. Normal coordination and gait  Musculoskeletal:  ROM 0-110, Ligaments intact,  Imaging Review Plain radiographs demonstrate severe degenerative joint disease of the left knee. The overall alignment is significant varus. The bone quality appears to be good for age and reported activity level.  Assessment/Plan: Primary osteoarthritis, left knee   The patient history, physical examination and imaging studies are consistent with severe degenerative joint disease of the left knee. The patient has failed conservative treatment.  The clearance notes were reviewed.  After discussion with the patient it was felt that Total Knee Replacement was indicated. The procedure,  risks, and benefits of total knee arthroplasty were presented and reviewed. The risks including but not limited to aseptic loosening, infection, blood clots, vascular injury,  stiffness, patella tracking problems complications among others were discussed. The patient acknowledged the explanation, agreed to proceed with the plan.  John Miranda 09/02/2015, 6:39 AM

## 2015-09-02 NOTE — Evaluation (Signed)
Physical Therapy Evaluation Patient Details Name: John Miranda MRN: [id number] DOB: 21-Feb-1951 Today's Date: 09/02/2015   History of Present Illness  64 y.o. male admitted to [facility] on 09/02/15 for elective left TKA.  Pt with significant PMHx of R charcot foot, DM, and cervical disc surgery.    Clinical Impression  Pt is POD #0 and moving well.  Min guard assist into the hallway with RW.  Exercise HEP initiated and education reviewed.   PT to follow acutely for deficits listed below.       Follow Up Recommendations Home health PT;Supervision for mobility/OOB    Equipment Recommendations  None recommended by PT    Recommendations for Other Services   NA    Precautions / Restrictions Precautions Precautions: Knee Precaution Booklet Issued: Yes (comment) Precaution Comments: exercise handout given Restrictions Weight Bearing Restrictions: Yes LLE Weight Bearing: Weight bearing as tolerated      Mobility  Bed Mobility Overal bed mobility: Needs Assistance Bed Mobility: Supine to Sit     Supine to sit: Min assist     General bed mobility comments: Min assist to help progress left leg over EOB.    Transfers Overall transfer level: Needs assistance Equipment used: Rolling walker (2 wheeled) Transfers: Sit to/from Stand Sit to Stand: Min guard         General transfer comment: min guard assist for safety.  Verbal cues for safe hand placement.   Ambulation/Gait Ambulation/Gait assistance: Min guard Ambulation Distance (Feet): 75 Feet Assistive device: Rolling walker (2 wheeled) Gait Pattern/deviations: Step-through pattern;Antalgic     General Gait Details: Verbal cues for safe use of RW, min guard assist for safety during gait due to first time up.          Balance Overall balance assessment: Needs assistance Sitting-balance support: Feet supported;No upper extremity supported Sitting balance-Leahy Scale: Good     Standing balance support: Bilateral upper  extremity supported Standing balance-Leahy Scale: Fair                               Pertinent Vitals/Pain Pain Assessment: 0-10 Pain Score: 4  Pain Location: left knee Pain Descriptors / Indicators: Aching;Burning Pain Intervention(s): Limited activity within patient's tolerance;Monitored during session;Repositioned    Home Living Family/patient expects to be discharged to:: Private residence Living Arrangements: Spouse/significant other Available Help at Discharge: Family;Available 24 hours/day (for one week) Type of Home: House Home Access: Ramped entrance     Home Layout: One level Home Equipment: Walker - 2 wheels;Bedside commode;Other (comment);Cane - single point (CPM)      Prior Function Level of Independence: Independent               Hand Dominance   Dominant Hand: Left    Extremity/Trunk Assessment   Upper Extremity Assessment: Defer to OT evaluation           Lower Extremity Assessment: LLE deficits/detail   LLE Deficits / Details: left leg with normal post op pain and weakness.  Ankle at least 3/5, knee 2+/5, hip 3-/5  Cervical / Trunk Assessment: Normal  Communication   Communication: No difficulties  Cognition Arousal/Alertness: Awake/alert Behavior During Therapy: WFL for tasks assessed/performed Overall Cognitive Status: Within Functional Limits for tasks assessed                         Exercises Total Joint Exercises Ankle Circles/Pumps: AROM;Both;20 reps;Supine Quad Sets: AROM;Left;10 reps;Supine  Towel Squeeze: AROM;Both;10 reps;Supine Heel Slides: AAROM;Left;10 reps;Supine Long Arc Quad: AROM;Left;10 reps;Seated      Assessment/Plan    PT Assessment Patient needs continued PT services  PT Diagnosis Difficulty walking;Abnormality of gait;Generalized weakness;Acute pain   PT Problem List Decreased strength;Decreased range of motion;Decreased activity tolerance;Decreased balance;Decreased  mobility;Decreased knowledge of use of DME;Decreased skin integrity  PT Treatment Interventions DME instruction;Gait training;Stair training;Functional mobility training;Therapeutic activities;Therapeutic exercise;Balance training;Neuromuscular re-education;Patient/family education;Modalities;Manual techniques   PT Goals (Current goals can be found in the Care Plan section) Acute Rehab PT Goals Patient Stated Goal: to go home tomorrow PT Goal Formulation: With patient Time For Goal Achievement: 09/09/15 Potential to Achieve Goals: Good    Frequency 7X/week    End of Session Equipment Utilized During Treatment: Gait belt Activity Tolerance: Patient tolerated treatment well Patient left: in chair;with call bell/phone within reach;with family/visitor present           Time: 1713-1740 PT Time Calculation (min) (ACUTE ONLY): 27 min   Charges:   PT Evaluation $Initial PT Evaluation Tier I: 1 Procedure PT Treatments $Gait Training: 8-22 mins        Disa Riedlinger B. Ryker [place], PT, DPT [phone]   09/02/2015, 5:52 PM

## 2015-09-02 NOTE — Progress Notes (Signed)
Orthopedic Tech Progress Note Patient Details:  John Miranda 04-18-1951 [id number]  CPM Left Knee CPM Left Knee: On Left Knee Flexion (Degrees): 90 Left Knee Extension (Degrees): 0 Additional Comments: trapeze bar patient helper Viewed order from doctor's order list  Hildred Priest 09/02/2015, 10:14 AM

## 2015-09-02 NOTE — Care Management (Signed)
Utilization review completed. Noha Karasik, RN Case Manager [phone]. 

## 2015-09-02 NOTE — Anesthesia Preprocedure Evaluation (Signed)
Anesthesia Evaluation  Patient identified by MRN, date of birth, ID band Patient awake    Reviewed: Allergy & Precautions, NPO status , Patient's Chart, lab work & pertinent test results  History of Anesthesia Complications Negative for: history of anesthetic complications  Airway Mallampati: II  TM Distance: >3 FB Neck ROM: Full    Dental  (+) Teeth Intact   Pulmonary neg pulmonary ROS,    breath sounds clear to auscultation       Cardiovascular hypertension, Pt. on medications (-) angina(-) Past MI and (-) CHF  Rhythm:Regular     Neuro/Psych negative neurological ROS  negative psych ROS   GI/Hepatic negative GI ROS, Neg liver ROS,   Endo/Other  diabetes, Type 2, Oral Hypoglycemic Agents  Renal/GU      Musculoskeletal  (+) Arthritis ,   Abdominal   Peds  Hematology   Anesthesia Other Findings   Reproductive/Obstetrics                             Anesthesia Physical Anesthesia Plan  ASA: II  Anesthesia Plan: Spinal and MAC   Post-op Pain Management:    Induction:   Airway Management Planned: Natural Airway, Nasal Cannula and Simple Face Mask  Additional Equipment: None  Intra-op Plan:   Post-operative Plan:   Informed Consent: I have reviewed the patients History and Physical, chart, labs and discussed the procedure including the risks, benefits and alternatives for the proposed anesthesia with the patient or authorized representative who has indicated his/her understanding and acceptance.   Dental advisory given  Plan Discussed with: Surgeon and CRNA  Anesthesia Plan Comments:         Anesthesia Quick Evaluation

## 2015-09-02 NOTE — Transfer of Care (Signed)
Immediate Anesthesia Transfer of Care Note  Patient: John Miranda  Procedure(s) Performed: Procedure(s): TOTAL LEFT KNEE ARTHROPLASTY (Left)  Patient Location: PACU  Anesthesia Type:MAC and Spinal  Level of Consciousness: awake, alert  and sedated  Airway & Oxygen Therapy: Patient Spontanous Breathing and Patient connected to nasal cannula oxygen  Post-op Assessment: Report given to RN, Post -op Vital signs reviewed and stable and Patient moving all extremities  Post vital signs: Reviewed and stable  Last Vitals:  Filed Vitals:   09/02/15 0558  BP: 143/71  Pulse: 74  Temp: 36.9 C  Resp: 20    Complications: No apparent anesthesia complications

## 2015-09-02 NOTE — Anesthesia Procedure Notes (Signed)
Spinal Patient location during procedure: OR Staffing Anesthesiologist: Cindra Austad Preanesthetic Checklist Completed: patient identified, surgical consent, pre-op evaluation, timeout performed, IV checked, risks and benefits discussed and monitors and equipment checked Spinal Block Patient position: sitting Prep: site prepped and draped and DuraPrep Patient monitoring: heart rate, cardiac monitor, continuous pulse ox and blood pressure Approach: midline Location: L3-4 Injection technique: single-shot Needle Needle type: Pencan  Needle gauge: 24 G Needle length: 10 cm Assessment Sensory level: T6   

## 2015-09-02 NOTE — Progress Notes (Signed)
Orthopedic Tech Progress Note Patient Details:  John Miranda 1951-08-13 [id number] On cpm at [place] Patient ID: [name], male   DOB: [birth year], 64 y.o.   MRN: [id number]   [name] 09/02/2015, 6:54 PM

## 2015-09-03 ENCOUNTER — Encounter (HOSPITAL_COMMUNITY): Payer: Self-pay | Admitting: Orthopedic Surgery

## 2015-09-03 LAB — BASIC METABOLIC PANEL
Anion gap: 7 (ref 5–15)
BUN: 11 mg/dL (ref 6–20)
CHLORIDE: 98 mmol/L — AB (ref 101–111)
CO2: 29 mmol/L (ref 22–32)
CREATININE: 0.83 mg/dL (ref 0.61–1.24)
Calcium: 8.7 mg/dL — ABNORMAL LOW (ref 8.9–10.3)
GFR calc non Af Amer: >60 mL/min (ref >60)
Glucose, Bld: 161 mg/dL — ABNORMAL HIGH (ref 65–99)
POTASSIUM: 3.9 mmol/L (ref 3.5–5.1)
SODIUM: 134 mmol/L — AB (ref 135–145)

## 2015-09-03 LAB — GLUCOSE, CAPILLARY
GLUCOSE-CAPILLARY: 184 mg/dL — AB (ref 65–99)
Glucose-Capillary: 136 mg/dL — ABNORMAL HIGH (ref 65–99)
Glucose-Capillary: 184 mg/dL — ABNORMAL HIGH (ref 65–99)

## 2015-09-03 LAB — CBC
HEMATOCRIT: 38.8 % — AB (ref 39.0–52.0)
Hemoglobin: 13.3 g/dL (ref 13.0–17.0)
MCH: 32.4 pg (ref 26.0–34.0)
MCHC: 34.3 g/dL (ref 30.0–36.0)
MCV: 94.6 fL (ref 78.0–100.0)
PLATELETS: 198 10*3/uL (ref 150–400)
RBC: 4.1 MIL/uL — AB (ref 4.22–5.81)
RDW: 13.8 % (ref 11.5–15.5)
WBC: 14.6 10*3/uL — AB (ref 4.0–10.5)

## 2015-09-03 LAB — HEMOGLOBIN A1C
Hgb A1c MFr Bld: 6.4 % — ABNORMAL HIGH (ref 4.8–5.6)
Mean Plasma Glucose: 137 mg/dL

## 2015-09-03 MED ORDER — LINAGLIPTIN 5 MG PO TABS: 5.0000 mg | ORAL_TABLET | Freq: Every day | ORAL | Status: DC

## 2015-09-03 MED ORDER — METFORMIN HCL ER 500 MG PO TB24: 2000.0000 mg | ORAL_TABLET | Freq: Every day | ORAL | Status: DC

## 2015-09-03 MED ORDER — OXYCODONE HCL 5 MG PO TABS: 5.0000 mg | ORAL_TABLET | ORAL | Status: DC | PRN

## 2015-09-03 MED ORDER — ENOXAPARIN SODIUM 40 MG/0.4ML ~~LOC~~ SOLN: 40.0000 mg | SUBCUTANEOUS | Status: DC

## 2015-09-03 MED ORDER — OXYCODONE HCL ER 10 MG PO T12A: 10.0000 mg | EXTENDED_RELEASE_TABLET | Freq: Two times a day (BID) | ORAL | Status: DC

## 2015-09-03 MED ORDER — METHOCARBAMOL 500 MG PO TABS: 500.0000 mg | ORAL_TABLET | Freq: Four times a day (QID) | ORAL | Status: DC | PRN

## 2015-09-03 NOTE — Progress Notes (Signed)
Physical Therapy Treatment Patient Details Name: John Miranda MRN: [id number] DOB: 1951-08-03 Today's Date: 09/03/2015    History of Present Illness 64 y.o. male admitted to [facility] on 09/02/15 for elective left TKA.  Pt with significant PMHx of R charcot foot, DM, and cervical disc surgery.      PT Comments    Pt was slightly more lethargic this morning and was nauseous upon entry.  Pt able to walk in hallway but had chair to follow and was not able to walk back to room.  He was able to perform exercises with assist from therapist.  Plan for D/C this afternoon.     Follow Up Recommendations  Home health PT;Supervision for mobility/OOB     Equipment Recommendations  None recommended by PT       Precautions / Restrictions Precautions Precautions: Knee Precaution Comments: reviewed Restrictions Weight Bearing Restrictions: Yes LLE Weight Bearing: Weight bearing as tolerated    Mobility  Bed Mobility Overal bed mobility: Needs Assistance Bed Mobility: Supine to Sit     Supine to sit: Min assist     General bed mobility comments: Pt. in recliner, returned to recliner at end of session.   Transfers Overall transfer level: Needs assistance Equipment used: Rolling walker (2 wheeled) Transfers: Sit to/from Stand Sit to Stand: Min guard         General transfer comment: Min guard to assist with power up and for safety.  Pt. able to perform correct hand placement and steady himself as he stood up.    Ambulation/Gait Ambulation/Gait assistance: Min guard Ambulation Distance (Feet): 100 Feet Assistive device: Rolling walker (2 wheeled) Gait Pattern/deviations: Step-to pattern;Decreased stance time - left;Decreased weight shift to left;Antalgic;Trunk flexed Gait velocity: decreased Gait velocity interpretation: <1.8 ft/sec, indicative of risk for recurrent falls General Gait Details: Min guard for safety as pt was nauseous during gait training.  Pt. able to use RW safely but  required verbal cues for correct heel toe sequence and proper posture.  Pt. limited by pain and relies heavily on arms during weight shift.       Balance Overall balance assessment: Needs assistance Sitting-balance support: Feet supported Sitting balance-Leahy Scale: Good     Standing balance support: Bilateral upper extremity supported Standing balance-Leahy Scale: Fair                      Cognition Arousal/Alertness: Awake/alert Behavior During Therapy: WFL for tasks assessed/performed Overall Cognitive Status: Within Functional Limits for tasks assessed                      Exercises Total Joint Exercises Ankle Circles/Pumps: AROM;Both;20 reps;Supine Quad Sets: AROM;Left;10 reps;Supine Short Arc Quad: AAROM;Left;10 reps;Seated Heel Slides: AAROM;Left;10 reps;Supine Hip ABduction/ADduction: AAROM;Left;10 reps;Seated Straight Leg Raises: AAROM;Left;10 reps;Seated Goniometric ROM: 9-55 (long sitting in recliner - AAROM)    General Comments General comments (skin integrity, edema, etc.): Pt. had blood stain on bandaging, pt. reported that MD had been informed.       Pertinent Vitals/Pain Pain Assessment: 0-10 Pain Score: 5  Pain Location: left knee Pain Descriptors / Indicators: Aching;Guarding;Grimacing Pain Intervention(s): Limited activity within patient's tolerance;Monitored during session;Repositioned;Ice applied           PT Goals (current goals can now be found in the care plan section) Acute Rehab PT Goals Patient Stated Goal: to go home Progress towards PT goals: Progressing toward goals    Frequency  7X/week    PT Plan Current  plan remains appropriate       End of Session Equipment Utilized During Treatment: Gait belt Activity Tolerance: Patient limited by pain Patient left: in chair;with call bell/phone within reach;with family/visitor present     Time: 1025-1050 PT Time Calculation (min) (ACUTE ONLY): 25 min  Charges:  1  Gait, [place], [place] - Office   09/03/2015, 11:53 AM

## 2015-09-03 NOTE — Care Management Note (Signed)
Case Management Note  Patient Details  Name: John Miranda MRN: [id number] Date of Birth: Jul 26, 1951  Subjective/Objective:          S/p left total knee arthroplasty           Action/Plan: Set up with [facility] for HHPT by MD office. Spoke with patient and his wife, no change in discharge plan. CPM, rolling walker and 3N1 have been delivered to home by [employer]. Patient's wife states that she will be able to assist patient after discharge. No other discharge needs identified.  Expected Discharge Date:                  Expected Discharge Plan:  [place]  In-House Referral:  NA  Discharge planning Services  CM Consult  Post Acute Care Choice:  Home Health, Durable Medical Equipment Choice offered to:  Patient  DME Arranged:  3-N-1, CPM, Walker rolling DME Agency:  Other - Comment  HH Arranged:  PT HH Agency:  [place]  Status of Service:  Completed, signed off  Medicare Important Message Given:    Date Medicare IM Given:    Medicare IM give by:    Date Additional Medicare IM Given:    Additional Medicare Important Message give by:     If discussed at [place] of Stay Meetings, dates discussed:    Additional Comments:  Nila Nephew, RN 09/03/2015, 10:15 AM

## 2015-09-03 NOTE — Anesthesia Postprocedure Evaluation (Addendum)
Anesthesia Post Note  Patient: John Miranda  Procedure(s) Performed: Procedure(s) (LRB): TOTAL LEFT KNEE ARTHROPLASTY (Left)  Patient location during evaluation: PACU Anesthesia Type: Spinal Level of consciousness: awake and alert Pain management: pain level controlled Respiratory status: spontaneous breathing Cardiovascular status: stable Postop Assessment: No signs of nausea or vomiting and Spinal receding Anesthetic complications: no    Last Vitals:  Filed Vitals:   09/03/15 0057 09/03/15 0617  BP: 141/74 150/66  Pulse: 100 103  Temp: 37.4 C 37.5 C  Resp: 16 16    Last Pain:  Filed Vitals:   09/03/15 0618  PainSc: Asleep    LLE Motor Response: Purposeful movement, Responds to commands LLE Sensation: Full sensation, No numbness, No tingling, Pain   RLE Sensation: Full sensation, No numbness, No tingling L Sensory Level: S1-Sole of foot, small toes R Sensory Level: S1-Sole of foot, small toes  [name]

## 2015-09-03 NOTE — Progress Notes (Signed)
SPORTS MEDICINE AND JOINT REPLACEMENT  John Mulch, MD   Carlynn Spry, PA-C [address], [place], [place]  [id number]                             [phone]   PROGRESS NOTE  Subjective:  negative for Chest Pain  negative for Shortness of Breath  negative for Nausea/Vomiting   negative for Calf Pain  negative for Bowel Movement   Tolerating Diet: yes         Patient reports pain as 5 on 0-10 scale.    Objective: Vital signs in last 24 hours:   Patient Vitals for the past 24 hrs:  BP Temp Temp src Pulse Resp SpO2  09/03/15 1030 129/77 mmHg - - - - -  09/03/15 0617 (!) 150/66 mmHg 99.5 F (37.5 C) Oral (!) 103 16 92 %  09/03/15 0057 (!) 141/74 mmHg 99.3 F (37.4 C) Oral 100 16 92 %  09/02/15 2018 (!) 145/65 mmHg 99.7 F (37.6 C) Oral 94 18 95 %    @flow {1959:LAST@   Intake/Output from previous day:   11/21 0701 - 11/22 0700 In: 740 [P.O.:240; I.V.:500] Out: 800 [Urine:800]   Intake/Output this shift:       Intake/Output      11/21 0701 - 11/22 0700 11/22 0701 - 11/23 0700   P.O. 240    I.V. (mL/kg) 500 (5.4)    Total Intake(mL/kg) 740 (7.9)    Urine (mL/kg/hr) 800 (0.4)    Total Output 800     Net -60             LABORATORY DATA:  Recent Labs  09/02/15 1447 09/03/15 0440  WBC 13.4* 14.6*  HGB 13.4 13.3  HCT 39.5 38.8*  PLT 184 198    Recent Labs  09/02/15 1447 09/03/15 0440  NA  --  134*  K  --  3.9  CL  --  98*  CO2  --  29  BUN  --  11  CREATININE 0.94 0.83  GLUCOSE  --  161*  CALCIUM  --  8.7*   Lab Results  Component Value Date   INR 1.19 08/19/2015    Examination:  General appearance: alert, cooperative and no distress Extremities: Homans sign is negative, no sign of DVT  Wound Exam: clean, dry, intact   Drainage:  Scant/small amount Serosanguinous exudate  Motor Exam: Quadriceps and Hamstrings Intact  Sensory Exam: Deep Peroneal normal   Assessment:    1 Day Post-Op  Procedure(s) (LRB): TOTAL LEFT KNEE  ARTHROPLASTY (Left)  ADDITIONAL DIAGNOSIS:  Active Problems:   S/P total knee arthroplasty  Acute Blood Loss Anemia   Plan: Physical Therapy as ordered Weight Bearing as Tolerated (WBAT)  DVT Prophylaxis:  Lovenox  DISCHARGE PLAN: Home  DISCHARGE NEEDS: HHPT, CPM, Walker and 3-in-1 comode seat         Aries Kasa 09/03/2015, 11:22 AM

## 2015-09-03 NOTE — Progress Notes (Signed)
Occupational Therapy Evaluation Patient Details Name: John Miranda MRN: [id number] DOB: [birth year] Today's Date: 09/03/2015    History of Present Illness 64 y.o. male admitted to [facility] on 09/02/15 for elective left TKA.  Pt with significant PMHx of R charcot foot, DM, and cervical disc surgery.     Clinical Impression   Pt admitted with the above diagnoses and presents with below problem list. Pt will benefit from continued acute OT to address the below listed deficits and maximize independence with BADLs prior to d/c home with spouse assisting. PTA pt was independent with ADLs. Pt is currently min guard to min A with functional transfers and LB ADLs. Pt somewhat limited by nausea this session. Session details below. OT to continue to follow acutely.      Follow Up Recommendations  Supervision/Assistance - 24 hour;No OT follow up    Equipment Recommendations  None recommended by OT    Recommendations for Other Services       Precautions / Restrictions Precautions Precautions: Knee Precaution Comments: reviewed Restrictions Weight Bearing Restrictions: Yes LLE Weight Bearing: Weight bearing as tolerated      Mobility Bed Mobility Overal bed mobility: Needs Assistance Bed Mobility: Supine to Sit     Supine to sit: Min assist     General bed mobility comments: Min A for advancing LLE across bed and into EOB position  Transfers Overall transfer level: Needs assistance Equipment used: Rolling walker (2 wheeled) Transfers: Sit to/from Stand Sit to Stand: Min guard;Min assist         General transfer comment: min guard from 3n1. Min A on initial standing from EOB to stabilize rw. Also min A to control descent sitting into recliner.     Balance Overall balance assessment: Needs assistance Sitting-balance support: No upper extremity supported;Feet supported Sitting balance-Leahy Scale: Good     Standing balance support: Bilateral upper extremity supported;During  functional activity Standing balance-Leahy Scale: Fair                              ADL Overall ADL's : Needs assistance/impaired Eating/Feeding: Set up;Sitting   Grooming: Set up;Sitting   Upper Body Bathing: Set up;Sitting   Lower Body Bathing: Minimal assistance;Sit to/from stand   Upper Body Dressing : Set up;Sitting   Lower Body Dressing: Minimal assistance;Sit to/from stand   Toilet Transfer: Minimal assistance;Ambulation;BSC;RW   Toileting- Clothing Manipulation and Hygiene: Sit to/from stand;Min guard   Tub/ Shower Transfer: Min guard;Walk-in shower;Ambulation;3 in 1;Rolling walker   Functional mobility during ADLs: Min guard;Rolling walker General ADL Comments: Min A needed at times during transfers: on initial stand to stabilize rw and to control descent from standing to sitting. Pt able to reach straight down leg to access feet. Educated on LB ADL technique and on toilet/shower transfer and setup.      Vision     Perception     Praxis      Pertinent Vitals/Pain Pain Assessment: 0-10 Pain Score: 4  Pain Location: left knee Pain Descriptors / Indicators: Aching Pain Intervention(s): Limited activity within patient's tolerance;Repositioned;Monitored during session     Hand Dominance Left   Extremity/Trunk Assessment Upper Extremity Assessment Upper Extremity Assessment: Overall WFL for tasks assessed   Lower Extremity Assessment Lower Extremity Assessment: Defer to PT evaluation   Cervical / Trunk Assessment Cervical / Trunk Assessment: Normal   Communication Communication Communication: No difficulties   Cognition Arousal/Alertness: Awake/alert Behavior During Therapy: WFL for tasks  assessed/performed Overall Cognitive Status: Within Functional Limits for tasks assessed                     General Comments       Exercises       Shoulder Instructions      Home Living Family/patient expects to be discharged to::  Private residence Living Arrangements: Spouse/significant other Available Help at Discharge: Family;Available 24 hours/day Type of Home: House Home Access: Ramped entrance     Home Layout: One level     Bathroom Shower/Tub: Walk-in shower;Door     Bathroom Accessibility: Yes   Home Equipment: Walker - 2 wheels;Bedside commode;Other (comment);Cane - single point          Prior Functioning/Environment Level of Independence: Independent             OT Diagnosis: Acute pain   OT Problem List: Impaired balance (sitting and/or standing);Decreased knowledge of use of DME or AE;Decreased knowledge of precautions;Pain   OT Treatment/Interventions: Self-care/ADL training;DME and/or AE instruction;Therapeutic activities;Patient/family education;Balance training    OT Goals(Current goals can be found in the care plan section) Acute Rehab OT Goals Patient Stated Goal: home OT Goal Formulation: With patient/family Time For Goal Achievement: 09/10/15 Potential to Achieve Goals: Good ADL Goals Pt Will Perform Grooming: with modified independence;standing Pt Will Perform Lower Body Bathing: with modified independence;sit to/from stand Pt Will Perform Lower Body Dressing: with modified independence;sit to/from stand Pt Will Transfer to Toilet: with modified independence;ambulating;bedside commode Pt Will Perform Tub/Shower Transfer: Shower transfer;with modified independence;shower seat;3 in 1;rolling walker Additional ADL Goal #1: Pt will complete bed mobility at mod I level to prepare for OOB ADLs.   OT Frequency: Min 2X/week   Barriers to D/C:            Co-evaluation              End of Session Equipment Utilized During Treatment: Gait belt;Rolling walker CPM Left Knee CPM Left Knee: Off Additional Comments: bone foam applied Nurse Communication: Other (comment) (nausea)  Activity Tolerance: Other (comment);Patient tolerated treatment well (nausea) Patient left:  in chair;with call bell/phone within reach;with family/visitor present   Time: [id number] OT Time Calculation (min): 21 min Charges:  OT General Charges $OT Visit: 1 Procedure OT Evaluation $Initial OT Evaluation Tier I: 1 Procedure G-Codes:    Hortencia Pilar Sep 10, 2015, 10:10 AM

## 2015-09-03 NOTE — Progress Notes (Signed)
Physical Therapy Treatment Patient Details Name: John Miranda MRN: [id number] DOB: [birth year] Today's Date: 09/03/2015    History of Present Illness 64 y.o. male admitted to [facility] on 09/02/15 for elective left TKA.  Pt with significant PMHx of R charcot foot, DM, and cervical disc surgery.      PT Comments    Pt no longer nauseous and able to walk in hallway without use of chair to rest.  Able to perform exercises at EOB.  Pt reported that PA was coming at 6 to D/C pt.  Pt should progress well with recovery and further PT.   Follow Up Recommendations  Home health PT;Supervision for mobility/OOB     Equipment Recommendations  None recommended by PT        Precautions / Restrictions Precautions Precautions: Knee Restrictions Weight Bearing Restrictions: Yes LLE Weight Bearing: Weight bearing as tolerated    Mobility  Bed Mobility Overal bed mobility: Needs Assistance Bed Mobility: Supine to Sit     Supine to sit: Min guard     General bed mobility comments: Min guard to bring LLE to EOB; pt. instructed to use R LE or UE in assisting LLE to EOB.   Transfers Overall transfer level: Modified independent Equipment used: Rolling walker (2 wheeled) Transfers: Sit to/from Stand Sit to Stand: Supervision         General transfer comment: supervision for safety throughout transfer.  Verbal cues for hand placement as pt tried to pull on RW when standing and sit<-> was very unsteady.  Sit <-> stand attemped again with pt. pushing with both hands on bed and it was much more controlled.   Ambulation/Gait Ambulation/Gait assistance: Min guard Ambulation Distance (Feet): 200 Feet Assistive device: Rolling walker (2 wheeled) Gait Pattern/deviations: Step-to pattern;Decreased stance time - left;Decreased stride length;Decreased weight shift to left;Antalgic     General Gait Details: Min guard for safety.  Pt. able to walk out of and back to room without use of chair.  Cues for  posture and to pick up R foot more during swing phase.        Balance Overall balance assessment: Needs assistance Sitting-balance support: Feet supported Sitting balance-Leahy Scale: Good     Standing balance support: Bilateral upper extremity supported Standing balance-Leahy Scale: Fair                      Cognition Arousal/Alertness: Awake/alert Behavior During Therapy: WFL for tasks assessed/performed Overall Cognitive Status: Within Functional Limits for tasks assessed                      Exercises Total Joint Exercises Long Arc Quad: AROM;Left;10 reps;Seated Knee Flexion: AROM;AAROM;Left;10 reps;Seated        Pertinent Vitals/Pain Pain Assessment: 0-10 Pain Score: 2  Pain Location: left knee Pain Descriptors / Indicators: Aching Pain Intervention(s): Limited activity within patient's tolerance;Monitored during session;Repositioned           PT Goals (current goals can now be found in the care plan section) Acute Rehab PT Goals Patient Stated Goal: to go home Progress towards PT goals: Progressing toward goals    Frequency  7X/week    PT Plan Current plan remains appropriate       End of Session Equipment Utilized During Treatment: Gait belt Activity Tolerance: Patient tolerated treatment well Patient left: in chair;with call bell/phone within reach;with family/visitor present     Time: [id number] PT Time Calculation (min) (ACUTE ONLY): 18 min  Charges:  [place], [place] - Office  09/03/2015, 4:02 PM

## 2015-09-03 NOTE — Discharge Instructions (Signed)

## 2015-09-04 NOTE — Op Note (Signed)
TOTAL KNEE REPLACEMENT OPERATIVE NOTE:  09/02/2015  6:31 AM  PATIENT:  John Miranda  64 y.o. male  PRE-OPERATIVE DIAGNOSIS:  primary osteoarthritis left knee  POST-OPERATIVE DIAGNOSIS:  primary osteoarthritis left knee  PROCEDURE:  Procedure(s): TOTAL LEFT KNEE ARTHROPLASTY  SURGEON:  Surgeon(s): Vickey Huger, MD  PHYSICIAN ASSISTANT: Carlynn Spry, [facility]  ANESTHESIA:   spinal  DRAINS: Hemovac  SPECIMEN: None  COUNTS:  Correct  TOURNIQUET:   Total Tourniquet Time Documented: Thigh (Left) - 53 minutes Total: Thigh (Left) - 53 minutes   DICTATION:  Indication for procedure:    The patient is a 64 y.o. male who has failed conservative treatment for primary osteoarthritis left knee.  Informed consent was obtained prior to anesthesia. The risks versus benefits of the operation were explain and in a way the patient can, and did, understand.   On the implant demand matching protocol, this patient scored 10.  Therefore, this patient did" "did not receive a polyethylene insert with vitamin E which is a high demand implant.  Description of procedure:     The patient was taken to the operating room and placed under anesthesia.  The patient was positioned in the usual fashion taking care that all body parts were adequately padded and/or protected.  I foley catheter was not placed.  A tourniquet was applied and the leg prepped and draped in the usual sterile fashion.  The extremity was exsanguinated with the esmarch and tourniquet inflated to 350 mmHg.  Pre-operative range of motion was normal.  The knee was in 5 degree of mild varus.  A midline incision approximately 6-7 inches long was made with a #10 blade.  A new blade was used to make a parapatellar arthrotomy going 2-3 cm into the quadriceps tendon, over the patella, and alongside the medial aspect of the patellar tendon.  A synovectomy was then performed with the #10 blade and forceps. I then elevated the deep MCL off the medial  tibial metaphysis subperiosteally around to the semimembranosus attachment.    I everted the patella and used calipers to measure patellar thickness.  I used the reamer to ream down to appropriate thickness to recreate the native thickness.  I then removed excess bone with the rongeur and sagittal saw.  I used the appropriately sized template and drilled the three lug holes.  I then put the trial in place and measured the thickness with the calipers to ensure recreation of the native thickness.  The trial was then removed and the patella subluxed and the knee brought into flexion.  A homan retractor was place to retract and protect the patella and lateral structures.  A Z-retractor was place medially to protect the medial structures.  The extra-medullary alignment system was used to make cut the tibial articular surface perpendicular to the anamotic axis of the tibia and in 3 degrees of posterior slope.  The cut surface and alignment jig was removed.  I then used the intramedullary alignment guide to make a 6 valgus cut on the distal femur.  I then marked out the epicondylar axis on the distal femur.  The posterior condylar axis measured 3 degrees.  I then used the anterior referencing sizer and measured the femur to be a size 9.  The 4-In-1 cutting block was screwed into place in external rotation matching the posterior condylar angle, making our cuts perpendicular to the epicondylar axis.  Anterior, posterior and chamfer cuts were made with the sagittal saw.  The cutting block and cut pieces  were removed.  A lamina spreader was placed in 90 degrees of flexion.  The ACL, PCL, menisci, and posterior condylar osteophytes were removed.  A 11 mm spacer blocked was found to offer good flexion and extension gap balance after minimal in degree releasing.   The scoop retractor was then placed and the femoral finishing block was pinned in place.  The small sagittal saw was used as well as the lug drill to finish the  femur.  The block and cut surfaces were removed and the medullary canal hole filled with autograft bone from the cut pieces.  The tibia was delivered forward in deep flexion and external rotation.  A size E tray was selected and pinned into place centered on the medial 1/3 of the tibial tubercle.  The reamer and keel was used to prepare the tibia through the tray.    I then trialed with the size 9 femur, size E tibia, a 11 mm insert and the 38 patella.  I had excellent flexion/extension gap balance, excellent patella tracking.  Flexion was full and beyond 120 degrees; extension was zero.  These components were chosen and the staff opened them to me on the back table while the knee was lavaged copiously and the cement mixed.  The soft tissue was infiltrated with 60cc of exparel 1.3% through a 21 gauge needle.  I cemented in the components and removed all excess cement.  The polyethylene tibial component was snapped into place and the knee placed in extension while cement was hardening.  The capsule was infilltrated with 30cc of .25% Marcaine with epinephrine.  A hemovac was place in the joint exiting superolaterally.  A pain pump was place superomedially superficial to the arthrotomy.  Once the cement was hard, the tourniquet was let down.  Hemostasis was obtained.  The arthrotomy was closed with figure-8 #1 vicryl sutures.  The deep soft tissues were closed with #0 vicryls and the subcuticular layer closed with a running #2-0 vicryl.  The skin was reapproximated and closed with skin staples.  The wound was dressed with xeroform, 4 x4's, 2 ABD sponges, a single layer of webril and a TED stocking.   The patient was then awakened, extubated, and taken to the recovery room in stable condition.  BLOOD LOSS:  300cc DRAINS: 1 hemovac, 1 pain catheter COMPLICATIONS:  None.  PLAN OF CARE: Admit to inpatient   PATIENT DISPOSITION:  PACU - hemodynamically stable.   Delay start of Pharmacological VTE agent  (>24hrs) due to surgical blood loss or risk of bleeding:  not applicable  Please fax a copy of this op note to my office at [phone] (please only include page 1 and 2 of the Case Information op note)

## 2015-09-10 NOTE — Discharge Summary (Signed)
SPORTS MEDICINE & JOINT REPLACEMENT   Lara Mulch, MD   Carlynn Spry, PA-C [place], [place], [place]  [address]                             [phone]  PATIENT ID: John Miranda        MRN:  [id number]          DOB/AGE: 01-09-1951 / 64 y.o.    DISCHARGE SUMMARY  ADMISSION DATE:    09/02/2015 DISCHARGE DATE:  09/03/2015  ADMISSION DIAGNOSIS: primary osteoarthritis left knee    DISCHARGE DIAGNOSIS:  primary osteoarthritis left knee    ADDITIONAL DIAGNOSIS: Active Problems:   S/P total knee arthroplasty  Past Medical History  Diagnosis Date  . Diabetes mellitus without complication ([place])   . Arthritis   . Charcot's joint of foot 11/2014    right foot    PROCEDURE: Procedure(s): TOTAL LEFT KNEE ARTHROPLASTY on 09/02/2015  CONSULTS:     HISTORY:  See H&P in chart  HOSPITAL COURSE:  Khaos Laducer is a 64 y.o. admitted on 09/02/2015 and found to have a diagnosis of primary osteoarthritis left knee.  After appropriate laboratory studies were obtained  they were taken to the operating room on 09/02/2015 and underwent Procedure(s): TOTAL LEFT KNEE ARTHROPLASTY.   They were given perioperative antibiotics:  Anti-infectives    Start     Dose/Rate Route Frequency Ordered Stop   09/02/15 1400  ceFAZolin (ANCEF) IVPB 2 g/50 mL premix     2 g 100 mL/hr over 30 Minutes Intravenous Every 6 hours 09/02/15 1110 09/02/15 2030   09/02/15 0700  ceFAZolin (ANCEF) IVPB 2 g/50 mL premix     2 g 100 mL/hr over 30 Minutes Intravenous To [facility] 08/30/15 1238 09/02/15 0745    .  Tolerated the procedure well.  Placed with a foley intraoperatively.  Given Ofirmev at induction and for 48 hours.    POD# 1: Vital signs were stable.  Patient denied Chest pain, shortness of breath, or calf pain.  Patient was started on Lovenox 30 mg subcutaneously twice daily at 8am.  Consults to PT, OT, and care management were made.  The patient was weight bearing as tolerated.  CPM was  placed on the operative leg 0-90 degrees for 6-8 hours a day.  Incentive spirometry was taught.  Dressing was changed.  Hemovac was discontinued.      POD #2, Continued  PT for ambulation and exercise program.  IV saline locked.  O2 discontinued.    The remainder of the hospital course was dedicated to ambulation and strengthening.   The patient was discharged on 1 day post op in  Good condition.  Blood products given:none  DIAGNOSTIC STUDIES: Recent vital signs: No data found.      Recent laboratory studies: No results for input(s): WBC, HGB, HCT, PLT in the last 168 hours. No results for input(s): NA, K, CL, CO2, BUN, CREATININE, GLUCOSE, CALCIUM in the last 168 hours. Lab Results  Component Value Date   INR 1.19 08/19/2015     Recent Radiographic Studies :  Dg Chest 2 View  08/19/2015  CLINICAL DATA:  Preoperative examination, history of diabetes EXAM: CHEST  2 VIEW COMPARISON:  Portable chest x-ray of June 13, 2015 FINDINGS: There is mild elevation of the right hemidiaphragm as compared to the left. This is not new. There is no alveolar infiltrate. There is no pleural effusion. The heart  is normal in size. The pulmonary vascularity is not engorged. The mediastinum is normal in width. There is mild multilevel degenerative disc disease of the mid and lower thoracic spine. IMPRESSION: Mild chronic elevation of the right hemidiaphragm with right lung hypoinflation. There is no alveolar pneumonia nor CHF. Electronically Signed   By: David  Martinique M.D.   On: 08/19/2015 09:17    DISCHARGE INSTRUCTIONS: Discharge Instructions    CPM    Complete by:  As directed   Continuous passive motion machine (CPM):      Use the CPM from 0 to 22for 6-8 hours per day.      You may increase by 10 per day.  You may break it up into 2 or 3 sessions per day.      Use CPM for 2 weeks or until you are told to stop.     Call MD / Call 911    Complete by:  As directed   If you experience chest pain or  shortness of breath, CALL 911 and be transported to the hospital emergency room.  If you develope a fever above 101 F, pus (white drainage) or increased drainage or redness at the wound, or calf pain, call your surgeon's office.     Change dressing    Complete by:  As directed   Change dressing on Wednesday, then change the dressing daily with sterile 4 x 4 inch gauze dressing and apply TED hose.     Constipation Prevention    Complete by:  As directed   Drink plenty of fluids.  Prune juice may be helpful.  You may use a stool softener, such as Colace (over the counter) 100 mg twice a day.  Use MiraLax (over the counter) for constipation as needed.     Diet - low sodium heart healthy    Complete by:  As directed      Driving restrictions    Complete by:  As directed   No driving for 6 weeks     Increase activity slowly as tolerated    Complete by:  As directed      Lifting restrictions    Complete by:  As directed   No lifting for 6 weeks     TED hose    Complete by:  As directed   Use stockings (TED hose) for 2 weeks on both leg(s).  You may remove them at night for sleeping.           DISCHARGE MEDICATIONS:     Medication List    TAKE these medications        enalapril 10 MG tablet  Commonly known as:  VASOTEC  Take 10 mg by mouth daily.     enoxaparin 40 MG/0.4ML injection  Commonly known as:  LOVENOX  Inject 0.4 mLs (40 mg total) into the skin daily.     glimepiride 4 MG tablet  Commonly known as:  AMARYL  Take 1 tablet by mouth every morning.     GLUCOSAMINE CHONDROITIN MSM PO  Take 1 tablet by mouth 2 (two) times daily.     JANUMET XR 50-1000 MG Tb24  Generic drug:  SitaGLIPtin-MetFORMIN HCl  Take 2 tablets by mouth every evening.     methocarbamol 500 MG tablet  Commonly known as:  ROBAXIN  Take 1-2 tablets (500-1,000 mg total) by mouth every 6 (six) hours as needed for muscle spasms.     oxyCODONE 5 MG immediate release tablet  Commonly known as:  Oxy  IR/ROXICODONE  Take 1-2 tablets (5-10 mg total) by mouth every 3 (three) hours as needed for breakthrough pain.     oxyCODONE 10 mg 12 hr tablet  Commonly known as:  OXYCONTIN  Take 1 tablet (10 mg total) by mouth every 12 (twelve) hours.        FOLLOW UP VISIT:       Follow-up Information    Follow up with [facility].   Why:  They will contact you to schedule home therapy visits.   Contact information:   [address] [place] [address] [phone]       Follow up with Rudean Haskell, MD. Call on 09/17/2015.   Specialty:  Orthopedic Surgery   Contact information:   [address] [place] [address] [phone]       DISPOSITION: HOME   CONDITION:  Good   Wenceslaus Gist 09/10/2015, 7:55 AM

## 2016-10-14 IMAGING — CR DG CHEST 2V
2 series · 2 of 2 positions shown · non-contrast
Comparison: Portable chest x-ray June 13, 2015

CLINICAL DATA: Preoperative examination, history of diabetes

EXAM:
CHEST  2 VIEW

[w chest pa]
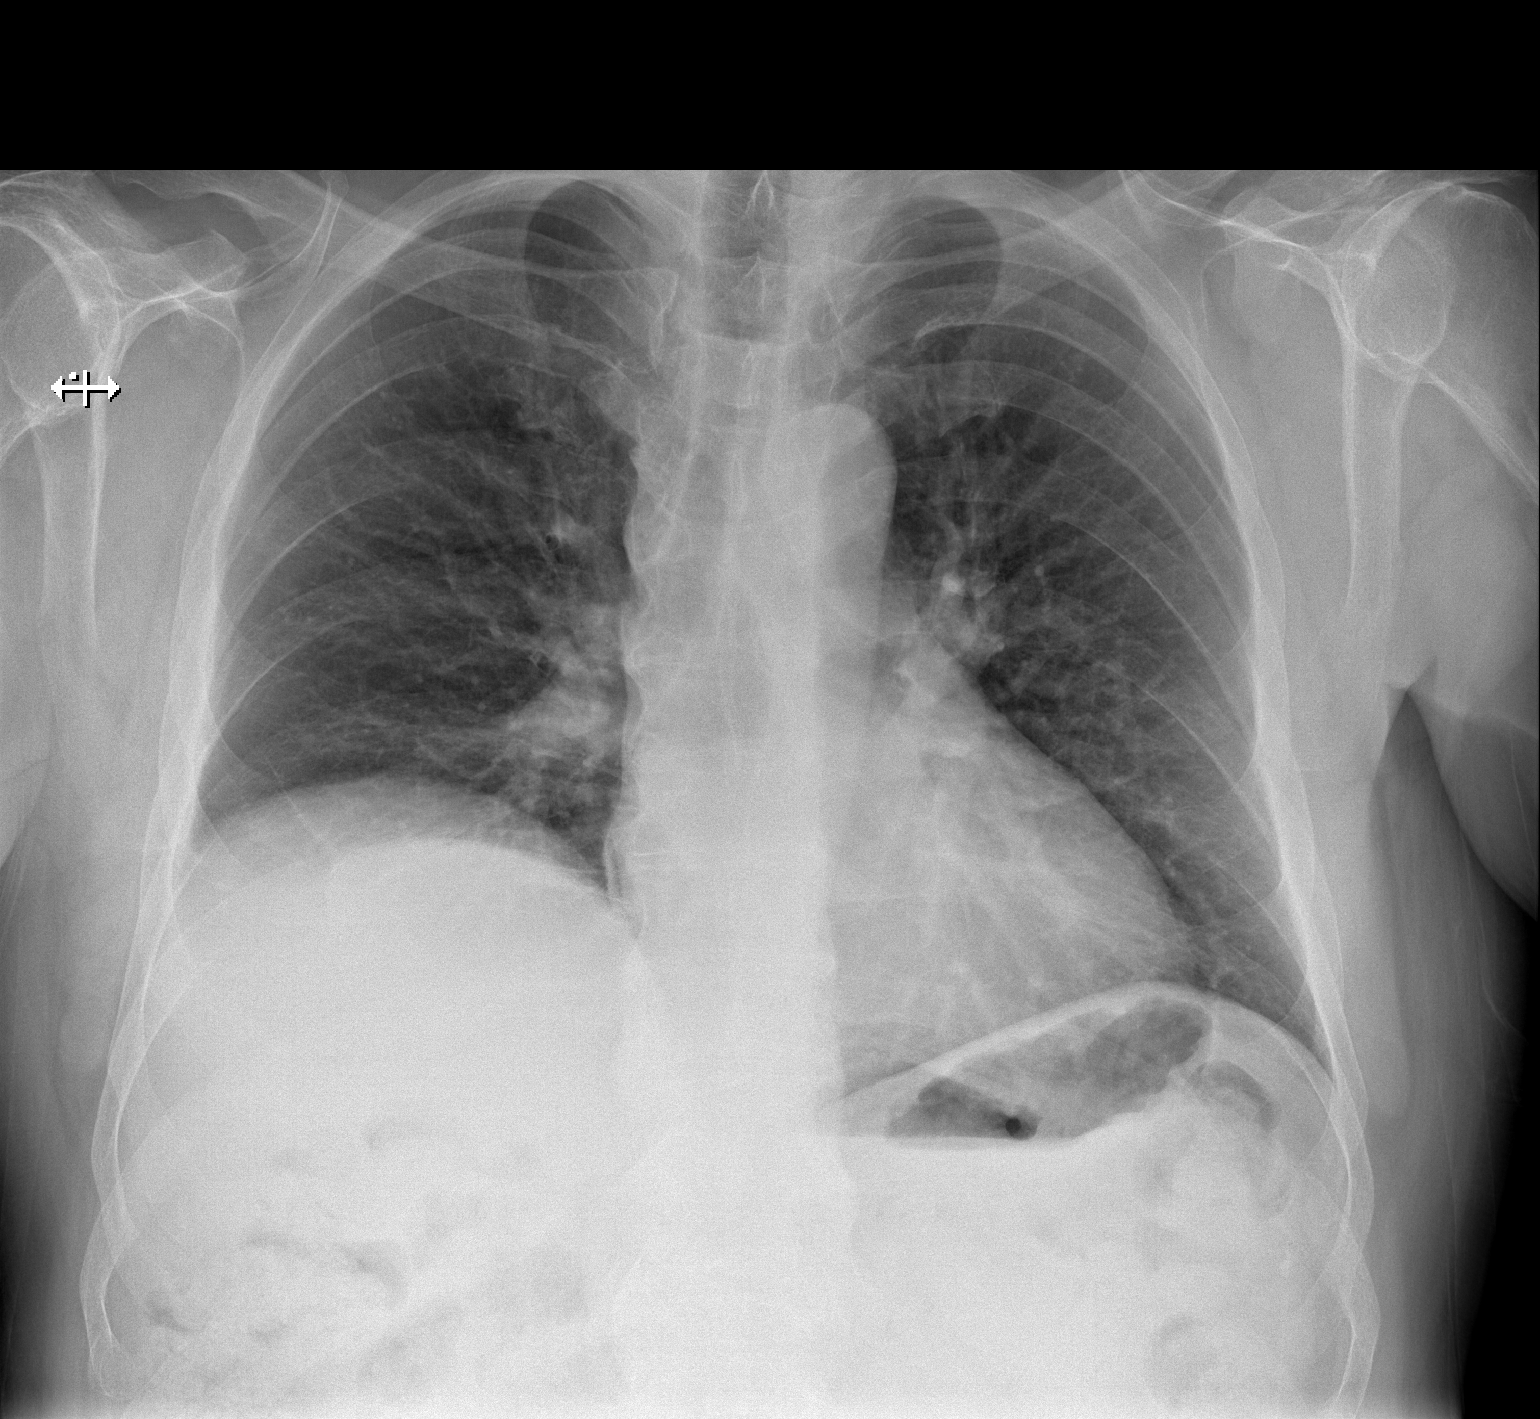

[w chest lat]
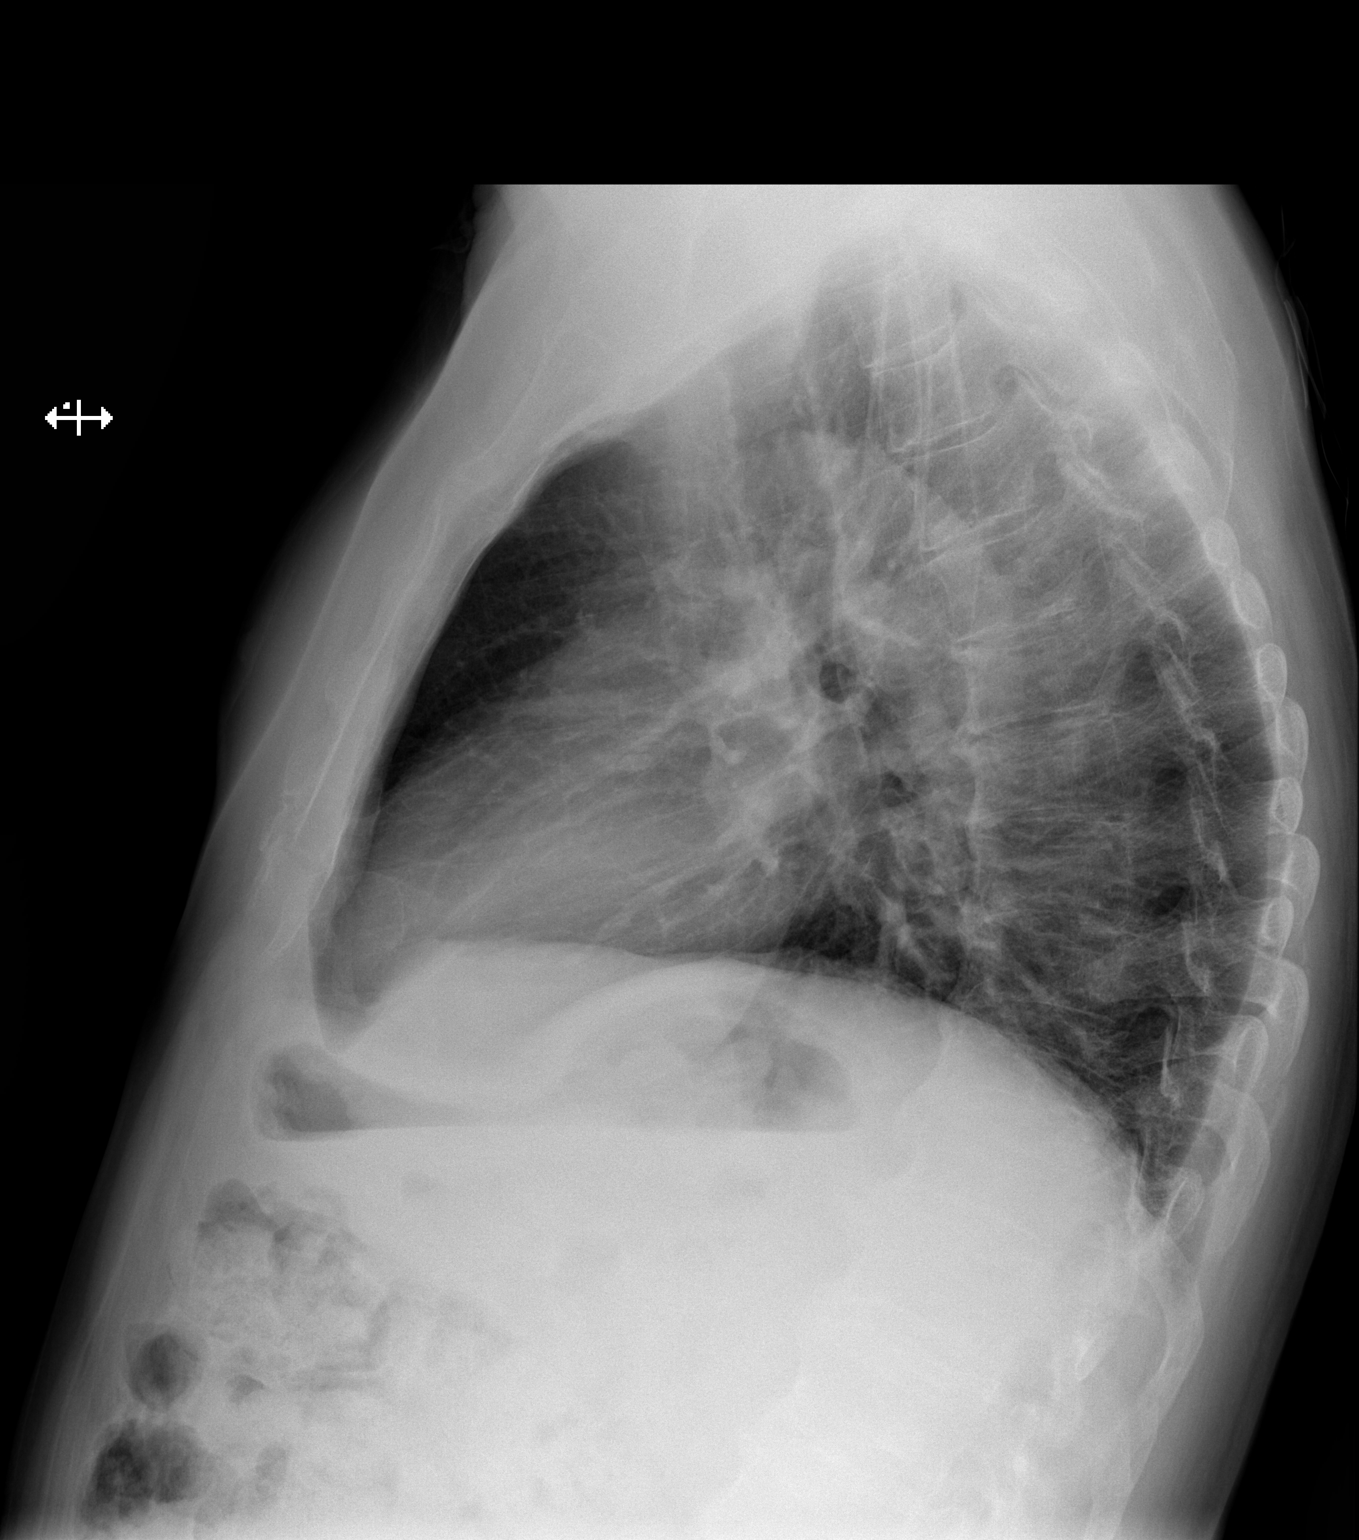

[2 of 2 positions shown; findings below may reference images not displayed]

FINDINGS: There is mild elevation of the right hemidiaphragm as compared to
the left. This is not new. There is no alveolar infiltrate. There is
no pleural effusion. The heart is normal in size. The pulmonary
vascularity is not engorged. The mediastinum is normal in width.
There is mild multilevel degenerative disc disease of the mid and
lower thoracic spine.
IMPRESSION: Mild chronic elevation of the right hemidiaphragm with right lung
hypoinflation. There is no alveolar pneumonia nor CHF.

## 2017-03-25 ENCOUNTER — Ambulatory Visit (INDEPENDENT_AMBULATORY_CARE_PROVIDER_SITE_OTHER): Payer: BLUE CROSS/BLUE SHIELD | Admitting: Allergy and Immunology

## 2017-03-25 ENCOUNTER — Encounter: Payer: Self-pay | Admitting: Allergy and Immunology

## 2017-03-25 VITALS — BP 130/72 | HR 72 | Temp 98.2°F | Resp 20 | Ht 70.0 in | Wt 206.0 lb

## 2017-03-25 DIAGNOSIS — J4530 Mild persistent asthma, uncomplicated: Secondary | ICD-10-CM

## 2017-03-25 NOTE — Progress Notes (Signed)
NEW PATIENT NOTE  Referring Provider: Mateo Flow, MD Primary Provider: Mateo Flow, MD Date of office visit: 03/25/2017    Subjective:   Chief Complaint:  John Miranda (DOB: [birth year]) is a 66 y.o. male who presents to the clinic on 03/25/2017 with a chief complaint of Cough and Allergies .  HPI: John Miranda presents to this clinic in evaluation of an event that occurred during spring 2018. Apparently during February and March he would develop problems after mowing lawns. He is in charge of mowing 8 lawns and he would finish around 6 or 7 PM at night and then he would start to develop a cough a few hours later and his cough would disturbed his sleep throughout the night. He went to visit Dr. Unk Miranda at urgent care and received a "shot" and an inhaler. When he uses his inhaler it did result in resolution of his symptoms. Since that shot and since the early spring has passed and the rain has washed the pollen away he is doing great. He can now mow lawns without any difficulty and does not have any cough or shortness of breath or chest pain or any other type of respiratory tract symptoms.   He does not smoke nor has he ever smoked but during his initial 94 years of life his mom and dad smokes indoors extensively.  Past Medical History:  Diagnosis Date  . Arthritis   . Charcot's joint of foot 11/2014   right foot  . Diabetes mellitus without complication [facility])     Past Surgical History:  Procedure Laterality Date  . [place]  . FINGER NAIL SURGERY Right 08/2014   index finger-local  . SINOSCOPY    . TOTAL KNEE ARTHROPLASTY Left 09/02/2015   Procedure: TOTAL LEFT KNEE ARTHROPLASTY;  Surgeon: John Huger, MD;  Location: [facility];  Service: Orthopedics;  Laterality: Left;    Allergies as of 03/25/2017   No Known Allergies     Medication List      enalapril 10 MG tablet Commonly known as:  VASOTEC Take 10 mg by mouth daily.   glimepiride 4 MG tablet Commonly  known as:  AMARYL Take 1 tablet by mouth every morning.   GLUCOSAMINE CHONDROITIN MSM PO Take 1 tablet by mouth 2 (two) times daily.   JANUMET XR 50-1000 MG Tb24 Generic drug:  SitaGLIPtin-MetFORMIN HCl Take 2 tablets by mouth every evening.   PROAIR HFA 108 (90 Base) MCG/ACT inhaler Generic drug:  albuterol Inhale 2 puffs into the lungs every 4 (four) hours as needed for wheezing or shortness of breath.       Review of systems negative except as noted in HPI / PMHx or noted below:  Review of Systems  Constitutional: Negative.   HENT: Negative.   Eyes: Negative.   Respiratory: Negative.   Cardiovascular: Negative.   Gastrointestinal: Negative.   Genitourinary: Negative.   Musculoskeletal: Negative.   Skin: Negative.   Neurological: Negative.   Endo/Heme/Allergies: Negative.   Psychiatric/Behavioral: Negative.     History reviewed. No pertinent family history.  Social History   Social History  . Marital status: Married    Spouse name: N/A  . Number of children: N/A  . Years of education: N/A   Occupational History  . Not on file.   Social History Miranda Topics  . Smoking status: Never Smoker  . Smokeless tobacco: Never Used  . Alcohol use No  . Drug use: No  .  Sexual activity: Not on file   Other Topics Concern  . Not on file   Social History Narrative  . No narrative on file    Environmental and Social history  Lives in a house with a dry environment, dogs and cats located inside the household, carpeting in the bedroom, plastic on the bed but not the pillow, and no smoking ongoing with inside the household. He mows lawns on a regular basis during the growing season of the year.  Objective:   Vitals:   03/25/17 1414  BP: 130/72  Pulse: 72  Resp: 20  Temp: 98.2 F (36.8 C)   Height: 5\' 10"  (177.8 cm) Weight: 206 lb (93.4 kg)  Physical Exam  Constitutional: He is well-developed, well-nourished, and in no distress.  HENT:  Head:  Normocephalic. Head is without right periorbital erythema and without left periorbital erythema.  Right Ear: Tympanic membrane, external ear and ear canal normal.  Left Ear: Tympanic membrane, external ear and ear canal normal.  Nose: Nose normal. No mucosal edema or rhinorrhea.  Mouth/Throat: Uvula is midline, oropharynx is clear and moist and mucous membranes are normal. No oropharyngeal exudate.  Eyes: Conjunctivae and lids are normal. Pupils are equal, round, and reactive to light.  Neck: Trachea normal. No tracheal tenderness present. No tracheal deviation present. No thyromegaly present.  Cardiovascular: Normal rate, regular rhythm, S1 normal, S2 normal and normal heart sounds.   No murmur heard. Pulmonary/Chest: Effort normal and breath sounds normal. No stridor. No tachypnea. No respiratory distress. He has no wheezes. He has no rales. He exhibits no tenderness.  Abdominal: Soft. He exhibits no distension and no mass. There is no hepatosplenomegaly. There is no tenderness. There is no rebound and no guarding.  Musculoskeletal: He exhibits no edema or tenderness.  Lymphadenopathy:       Head (right side): No tonsillar adenopathy present.       Head (left side): No tonsillar adenopathy present.    He has no cervical adenopathy.    He has no axillary adenopathy.  Neurological: He is alert. Gait normal.  Skin: No rash noted. He is not diaphoretic. No erythema. No pallor. Nails show no clubbing.  Psychiatric: Mood and affect normal.    Diagnostics: Allergy skin tests were not performed.   Spirometry was performed and demonstrated an FEV1 of 2.37 @ 68 % of predicted. Following the administration of nebulized albuterol his FEV1 to 2.67 which was an increase in the FEV1 of 13%.  Assessment and Plan:    1. Asthma, well controlled, mild persistent     1. Allergen avoidance measures - mask while mowing  2. Starting today utilize the following:   A. Asmanex 200 HFA 2 inhalations twice  a day with spacer  B. montelukast 10 mg tablet 1 time per day  3. If needed: ProAir HFA 2 puffs every 4-6 hours with spacer  4. Further evaluation and treatment?  5. Return to clinic in 4 weeks or earlier if problem  John Miranda appears to have significant evidence of inflammation of his respiratory tract even though he has resolved the bulk of his respiratory tract symptoms over the course of the past month or so. I'm going to start him on anti-inflammatory agents and we will see what type of effect we get over the course the next 4 weeks and then make a determination about how to proceed pending his response.  Allena Katz, MD Allergy / Immunology [place]

## 2017-03-25 NOTE — Patient Instructions (Addendum)
  1. Allergen avoidance measures - mask while mowing  2. Starting today utilize the following:   A. Asmanex 200 HFA 2 inhalations twice a day with spacer  B. montelukast 10 mg tablet 1 time per day  3. If needed: ProAir HFA 2 puffs every 4-6 hours with spacer  4. Further evaluation and treatment?  5. Return to clinic in 4 weeks or earlier if problem

## 2017-03-26 MED ORDER — MONTELUKAST SODIUM 10 MG PO TABS: 10.0000 mg | ORAL_TABLET | Freq: Every day | ORAL | 5 refills | Status: DC

## 2017-03-26 MED ORDER — MOMETASONE FUROATE 200 MCG/ACT IN AERO: 2.0000 | INHALATION_SPRAY | Freq: Two times a day (BID) | RESPIRATORY_TRACT | 5 refills | Status: DC

## 2017-04-26 ENCOUNTER — Encounter: Payer: Self-pay | Admitting: Allergy and Immunology

## 2017-04-26 ENCOUNTER — Ambulatory Visit (INDEPENDENT_AMBULATORY_CARE_PROVIDER_SITE_OTHER): Payer: BLUE CROSS/BLUE SHIELD | Admitting: Allergy and Immunology

## 2017-04-26 VITALS — BP 134/70 | HR 76 | Resp 18

## 2017-04-26 DIAGNOSIS — J4530 Mild persistent asthma, uncomplicated: Secondary | ICD-10-CM | POA: Diagnosis not present

## 2017-04-26 MED ORDER — MONTELUKAST SODIUM 10 MG PO TABS: 10.0000 mg | ORAL_TABLET | Freq: Every day | ORAL | 1 refills | Status: DC

## 2017-04-26 MED ORDER — ALBUTEROL SULFATE HFA 108 (90 BASE) MCG/ACT IN AERS: INHALATION_SPRAY | RESPIRATORY_TRACT | 0 refills | Status: AC

## 2017-04-26 NOTE — Progress Notes (Signed)
Follow-up Note  Referring Provider: Mateo Flow, MD Primary Provider: Mateo Flow, MD Date of Office Visit: 04/26/2017  Subjective:   John Miranda (DOB: [birth year]) is a 66 y.o. male who returns to the [facility] [place] on 04/26/2017 in re-evaluation of the following:  HPI: John Miranda returns to this clinic in reevaluation of his asthma. I last saw him in this clinic 03/25/2017 for his initial evaluation at which point in time we placed him on montelukast and Asmanex.  He has not used any Asmanex although he has continued to use montelukast. Overall he feels as though he is doing very well. He can mow lawns without any problem. He is not using a mask when he mows because it is just "too hot". He never obtained a SABA  Allergies as of 04/26/2017   No Known Allergies     Medication List      enalapril 10 MG tablet Commonly known as:  VASOTEC Take 10 mg by mouth daily.   glimepiride 4 MG tablet Commonly known as:  AMARYL Take 1 tablet by mouth every morning.   GLUCOSAMINE CHONDROITIN MSM PO Take 1 tablet by mouth 2 (two) times daily.   JANUMET XR 50-1000 MG Tb24 Generic drug:  SitaGLIPtin-MetFORMIN HCl Take 2 tablets by mouth every evening.   Mometasone Furoate 200 MCG/ACT Aero Commonly known as:  ASMANEX HFA Inhale 2 puffs into the lungs 2 (two) times daily.   montelukast 10 MG tablet Commonly known as:  SINGULAIR Take 1 tablet (10 mg total) by mouth at bedtime.       Past Medical History:  Diagnosis Date  . Arthritis   . Asthma   . Charcot's joint of foot 11/2014   right foot  . Diabetes mellitus without complication [facility])     Past Surgical History:  Procedure Laterality Date  . [place]  . FINGER NAIL SURGERY Right 08/2014   index finger-local  . SINOSCOPY    . TOTAL KNEE ARTHROPLASTY Left 09/02/2015   Procedure: TOTAL LEFT KNEE ARTHROPLASTY;  Surgeon: Vickey Huger, MD;  Location: [facility];  Service: Orthopedics;  Laterality:  Left;    Review of systems negative except as noted in HPI / PMHx or noted below:  Review of Systems  Constitutional: Negative.   HENT: Negative.   Eyes: Negative.   Respiratory: Negative.   Cardiovascular: Negative.   Gastrointestinal: Negative.   Genitourinary: Negative.   Musculoskeletal: Negative.   Skin: Negative.   Neurological: Negative.   Endo/Heme/Allergies: Negative.   Psychiatric/Behavioral: Negative.      Objective:   Vitals:   04/26/17 1556  BP: 134/70  Pulse: 76  Resp: 18          Physical Exam  Constitutional: He is well-developed, well-nourished, and in no distress.  HENT:  Head: Normocephalic.  Right Ear: Tympanic membrane, external ear and ear canal normal.  Left Ear: Tympanic membrane, external ear and ear canal normal.  Nose: Nose normal. No mucosal edema or rhinorrhea.  Mouth/Throat: Uvula is midline, oropharynx is clear and moist and mucous membranes are normal. No oropharyngeal exudate.  Eyes: Conjunctivae are normal.  Neck: Trachea normal. No tracheal tenderness present. No tracheal deviation present. No thyromegaly present.  Cardiovascular: Normal rate, regular rhythm, S1 normal, S2 normal and normal heart sounds.   No murmur heard. Pulmonary/Chest: Breath sounds normal. No stridor. No respiratory distress. He has no wheezes. He has no rales.  Musculoskeletal: He exhibits no edema.  Lymphadenopathy:       Head (right side): No tonsillar adenopathy present.       Head (left side): No tonsillar adenopathy present.    He has no cervical adenopathy.  Neurological: He is alert. Gait normal.  Skin: No rash noted. He is not diaphoretic. No erythema. Nails show no clubbing.  Psychiatric: Mood and affect normal.    Diagnostics:    Spirometry was performed and demonstrated an FEV1 of 2.35 at 68 % of predicted.  The patient had an Asthma Control Test with the following results: ACT Total Score: 23.    Assessment and Plan:   1. Asthma, well  controlled, mild persistent     1. Allergen avoidance measures as best as possible  2. EVERY DAY utilize the following:   A. Asmanex 200 HFA 2 inhalations twice a day with spacer  B. montelukast 10 mg tablet 1 time per day  3. If needed: ProAir HFA or similar (Albuterol) 2 puffs every 4-6 hours with spacer  4. Return to clinic in 4 weeks or earlier if problem  [name] needs to use anti-inflammatory agents for his respiratory tract on a daily basis for at least 4 weeks to see if we can reverse his abnormal spirometric readings. During his initial evaluation there was a suggestion that he had inflamed lungs and there was reversibility. I have informed him about this requirement and I will see him back in this clinic in 4 weeks or earlier if there is a problem.  Allena Katz, MD Allergy / Immunology [place]

## 2017-04-26 NOTE — Patient Instructions (Addendum)
  1. Allergen avoidance measures as best as possible  2. EVERY DAY utilize the following:   A. Asmanex 200 HFA 2 inhalations twice a day with spacer  B. montelukast 10 mg tablet 1 time per day  3. If needed: ProAir HFA or similar (Albuterol) 2 puffs every 4-6 hours with spacer  4. Return to clinic in 4 weeks or earlier if problem

## 2017-05-31 ENCOUNTER — Ambulatory Visit (INDEPENDENT_AMBULATORY_CARE_PROVIDER_SITE_OTHER): Payer: BLUE CROSS/BLUE SHIELD | Admitting: Allergy and Immunology

## 2017-05-31 ENCOUNTER — Encounter: Payer: Self-pay | Admitting: Allergy and Immunology

## 2017-05-31 VITALS — BP 148/68 | HR 72 | Resp 20

## 2017-05-31 DIAGNOSIS — J4530 Mild persistent asthma, uncomplicated: Secondary | ICD-10-CM

## 2017-05-31 NOTE — Patient Instructions (Addendum)
  1. Allergen avoidance measures as best as possible  2. Decrease Asmanex 200 HFA 2 inhalations ONE TIME a day with spacer  3. Do not restart montelukast   4. If needed: ProAir HFA or similar (Albuterol) 2 puffs every 4-6 hours with spacer  5. Return to clinic in 6 months or earlier if problem  6. Obtain fall flu vaccine  7. Will need to have plan for spring time starting Valentine's Day 2019

## 2017-05-31 NOTE — Progress Notes (Signed)
Follow-up Note  Referring Provider: Mateo Flow, MD Primary Provider: Mateo Flow, MD Date of Office Visit: 05/31/2017  Subjective:   John Miranda (DOB: 04-Aug-1951) is a 66 y.o. male who returns to the [facility] [place] on 05/31/2017 in re-evaluation of the following:  HPI: Remo Lipps returns to this clinic in reevaluation of his asthma. His last visit to this clinic was 04/26/2017 and he has been consistently using his Asmanex at full dose since that point in time.  He feels very good and has no coughing at this point and can exercise at [employer] 3 times a week with no difficulty and can mow his lawns with no difficulty. He does not use a short acting bronchodilator.  He did stop his Singulair for the past several days because he thought that this medication was "drying him up" and making his voice somewhat raspy.  Allergies as of 05/31/2017   No Known Allergies     Medication List      albuterol 108 (90 Base) MCG/ACT inhaler Commonly known as:  PROAIR HFA Inhale two puffs every four to six hours as needed for cough or wheeze.   enalapril 10 MG tablet Commonly known as:  VASOTEC Take 10 mg by mouth daily.   glimepiride 4 MG tablet Commonly known as:  AMARYL Take 1 tablet by mouth every morning.   GLUCOSAMINE CHONDROITIN MSM PO Take 1 tablet by mouth 2 (two) times daily.   JANUMET XR 50-1000 MG Tb24 Generic drug:  SitaGLIPtin-MetFORMIN HCl Take 2 tablets by mouth every evening.   Mometasone Furoate 200 MCG/ACT Aero Commonly known as:  ASMANEX HFA Inhale 2 puffs into the lungs 2 (two) times daily.   montelukast 10 MG tablet Commonly known as:  SINGULAIR Take 1 tablet (10 mg total) by mouth at bedtime.       Past Medical History:  Diagnosis Date  . Arthritis   . Asthma   . Charcot's joint of foot 11/2014   right foot  . Diabetes mellitus without complication [facility])     Past Surgical History:  Procedure Laterality Date  . [place]  . FINGER NAIL SURGERY Right 08/2014   index finger-local  . SINOSCOPY    . TOTAL KNEE ARTHROPLASTY Left 09/02/2015   Procedure: TOTAL LEFT KNEE ARTHROPLASTY;  Surgeon: Vickey Huger, MD;  Location: [facility];  Service: Orthopedics;  Laterality: Left;    Review of systems negative except as noted in HPI / PMHx or noted below:  Review of Systems  Constitutional: Negative.   HENT: Negative.   Eyes: Negative.   Respiratory: Negative.   Cardiovascular: Negative.   Gastrointestinal: Negative.   Genitourinary: Negative.   Musculoskeletal: Negative.   Skin: Negative.   Neurological: Negative.   Endo/Heme/Allergies: Negative.   Psychiatric/Behavioral: Negative.      Objective:   Vitals:   05/31/17 1803  BP: (!) 148/68  Pulse: 72  Resp: 20          Physical Exam  Constitutional: He is well-developed, well-nourished, and in no distress.  Slightly raspy voice  HENT:  Head: Normocephalic.  Right Ear: Tympanic membrane, external ear and ear canal normal.  Left Ear: Tympanic membrane, external ear and ear canal normal.  Nose: Nose normal. No mucosal edema or rhinorrhea.  Mouth/Throat: Uvula is midline, oropharynx is clear and moist and mucous membranes are normal. No oropharyngeal exudate.  Eyes: Conjunctivae are normal.  Neck: Trachea normal. No tracheal tenderness present.  No tracheal deviation present. No thyromegaly present.  Cardiovascular: Normal rate, regular rhythm, S1 normal, S2 normal and normal heart sounds.   No murmur heard. Pulmonary/Chest: Breath sounds normal. No stridor. No respiratory distress. He has no wheezes. He has no rales.  Musculoskeletal: He exhibits no edema.  Lymphadenopathy:       Head (right side): No tonsillar adenopathy present.       Head (left side): No tonsillar adenopathy present.    He has no cervical adenopathy.  Neurological: He is alert. Gait normal.  Skin: No rash noted. He is not diaphoretic. No erythema. Nails show no  clubbing.  Psychiatric: Mood and affect normal.    Diagnostics:    Spirometry was performed and demonstrated an FEV1 of 2.17 at 62 % of predicted.  The patient had an Asthma Control Test with the following results: ACT Total Score: 24.    Assessment and Plan:   1. Asthma, well controlled, mild persistent     1. Allergen avoidance measures as best as possible  2. Decrease Asmanex 200 HFA 2 inhalations ONE TIME a day with spacer  3. Do not restart montelukast   4. If needed: ProAir HFA or similar (Albuterol) 2 puffs every 4-6 hours with spacer  5. Return to clinic in 6 months or earlier if problem  6. Obtain fall flu vaccine  7. Will need to have plan for spring time starting Valentine's Day 2019  I will make the assumption that Remo Lipps will do well as we move forward now that we are out of the springtime season and I'm going to have him use a relatively low dose of inhaled steroid and not restart his leukotriene modifier at this point. I suspect that some of his raspy voice is secondary to his Asmanex use. For this upcoming spring season we will need to provide him a plan starting on Valentine's Day so that he does not get into problems as he goes through the spring. I will regroup with him in 6 months or earlier if there is a problem.  Allena Katz, MD Allergy / Immunology [place]

## 2018-01-21 ENCOUNTER — Other Ambulatory Visit: Payer: Self-pay | Admitting: Allergy and Immunology

## 2018-03-31 ENCOUNTER — Other Ambulatory Visit: Payer: Self-pay | Admitting: Allergy and Immunology

## 2018-06-17 ENCOUNTER — Other Ambulatory Visit (HOSPITAL_COMMUNITY): Payer: Self-pay | Admitting: Orthopedic Surgery

## 2018-06-22 ENCOUNTER — Other Ambulatory Visit (HOSPITAL_COMMUNITY): Payer: Self-pay | Admitting: Orthopedic Surgery

## 2018-06-29 ENCOUNTER — Other Ambulatory Visit: Payer: Self-pay | Admitting: Allergy and Immunology

## 2018-08-10 ENCOUNTER — Other Ambulatory Visit: Payer: Self-pay | Admitting: Allergy and Immunology

## 2018-08-26 ENCOUNTER — Encounter (HOSPITAL_BASED_OUTPATIENT_CLINIC_OR_DEPARTMENT_OTHER): Payer: Self-pay | Admitting: *Deleted

## 2018-08-26 ENCOUNTER — Other Ambulatory Visit: Payer: Self-pay

## 2018-08-30 ENCOUNTER — Encounter (HOSPITAL_BASED_OUTPATIENT_CLINIC_OR_DEPARTMENT_OTHER)
Admission: RE | Admit: 2018-08-30 | Discharge: 2018-08-30 | Disposition: A | Discharge status: Home / Self Care | Payer: BLUE CROSS/BLUE SHIELD | Source: Ambulatory Visit | Attending: Orthopedic Surgery | Admitting: Orthopedic Surgery

## 2018-08-30 DIAGNOSIS — Z01818 Encounter for other preprocedural examination: Secondary | ICD-10-CM | POA: Insufficient documentation

## 2018-08-30 LAB — BASIC METABOLIC PANEL
Anion gap: 8 (ref 5–15)
BUN: 24 mg/dL — AB (ref 8–23)
CHLORIDE: 103 mmol/L (ref 98–111)
CO2: 26 mmol/L (ref 22–32)
Calcium: 9.6 mg/dL (ref 8.9–10.3)
Creatinine, Ser: 1.16 mg/dL (ref 0.61–1.24)
GFR calc Af Amer: >60 mL/min (ref >60)
GFR calc non Af Amer: >60 mL/min (ref >60)
GLUCOSE: 175 mg/dL — AB (ref 70–99)
Potassium: 4.6 mmol/L (ref 3.5–5.1)
SODIUM: 137 mmol/L (ref 135–145)

## 2018-08-31 NOTE — Progress Notes (Signed)
EKG  Reviewed by Dr. Lissa Hoard, will proceed with surgery as scheduled.

## 2018-09-01 ENCOUNTER — Other Ambulatory Visit: Payer: Self-pay

## 2018-09-01 ENCOUNTER — Ambulatory Visit (HOSPITAL_BASED_OUTPATIENT_CLINIC_OR_DEPARTMENT_OTHER): Payer: BLUE CROSS/BLUE SHIELD | Admitting: Anesthesiology

## 2018-09-01 ENCOUNTER — Ambulatory Visit (HOSPITAL_BASED_OUTPATIENT_CLINIC_OR_DEPARTMENT_OTHER)
Admission: RE | Admit: 2018-09-01 | Discharge: 2018-09-01 | Disposition: A | Discharge status: Home / Self Care | Payer: BLUE CROSS/BLUE SHIELD | Source: Ambulatory Visit | Attending: Orthopedic Surgery | Admitting: Orthopedic Surgery

## 2018-09-01 ENCOUNTER — Encounter (HOSPITAL_BASED_OUTPATIENT_CLINIC_OR_DEPARTMENT_OTHER)
Admission: RE | Disposition: A | Discharge status: Home / Self Care | Payer: Self-pay | Source: Ambulatory Visit | Attending: Orthopedic Surgery

## 2018-09-01 ENCOUNTER — Encounter (HOSPITAL_BASED_OUTPATIENT_CLINIC_OR_DEPARTMENT_OTHER): Payer: Self-pay | Admitting: Anesthesiology

## 2018-09-01 DIAGNOSIS — M899 Disorder of bone, unspecified: Secondary | ICD-10-CM | POA: Diagnosis not present

## 2018-09-01 DIAGNOSIS — Z79899 Other long term (current) drug therapy: Secondary | ICD-10-CM | POA: Insufficient documentation

## 2018-09-01 DIAGNOSIS — M216X1 Other acquired deformities of right foot: Secondary | ICD-10-CM | POA: Diagnosis present

## 2018-09-01 DIAGNOSIS — Z7984 Long term (current) use of oral hypoglycemic drugs: Secondary | ICD-10-CM | POA: Insufficient documentation

## 2018-09-01 DIAGNOSIS — E119 Type 2 diabetes mellitus without complications: Secondary | ICD-10-CM | POA: Diagnosis not present

## 2018-09-01 DIAGNOSIS — Z96652 Presence of left artificial knee joint: Secondary | ICD-10-CM | POA: Diagnosis not present

## 2018-09-01 DIAGNOSIS — J45909 Unspecified asthma, uncomplicated: Secondary | ICD-10-CM | POA: Insufficient documentation

## 2018-09-01 DIAGNOSIS — M1990 Unspecified osteoarthritis, unspecified site: Secondary | ICD-10-CM | POA: Insufficient documentation

## 2018-09-01 HISTORY — PX: BONE EXOSTOSIS EXCISION: SHX1249

## 2018-09-01 LAB — GLUCOSE, CAPILLARY
Glucose-Capillary: 103 mg/dL — ABNORMAL HIGH (ref 70–99)
Glucose-Capillary: 84 mg/dL (ref 70–99)

## 2018-09-01 SURGERY — EXCISION, EXOSTOSIS
Anesthesia: General | Site: Foot | Laterality: Right

## 2018-09-01 MED ORDER — LACTATED RINGERS IV SOLN
INTRAVENOUS | Status: DC
  Administered 2018-09-01 (×2): via INTRAVENOUS

## 2018-09-01 MED ORDER — 0.9 % SODIUM CHLORIDE (POUR BTL) OPTIME
TOPICAL | Status: DC | PRN
  Administered 2018-09-01: 300 mL

## 2018-09-01 MED ORDER — FENTANYL CITRATE (PF) 100 MCG/2ML IJ SOLN
50.0000 ug | INTRAMUSCULAR | Status: DC | PRN
  Administered 2018-09-01: 50 ug via INTRAVENOUS

## 2018-09-01 MED ORDER — MIDAZOLAM HCL 2 MG/2ML IJ SOLN
INTRAMUSCULAR | Status: AC
  Filled 2018-09-01: qty 2

## 2018-09-01 MED ORDER — DEXAMETHASONE SODIUM PHOSPHATE 10 MG/ML IJ SOLN
INTRAMUSCULAR | Status: AC
  Filled 2018-09-01: qty 1

## 2018-09-01 MED ORDER — FENTANYL CITRATE (PF) 100 MCG/2ML IJ SOLN
INTRAMUSCULAR | Status: AC
  Filled 2018-09-01: qty 2

## 2018-09-01 MED ORDER — PROPOFOL 10 MG/ML IV BOLUS
INTRAVENOUS | Status: DC | PRN
  Administered 2018-09-01: 180 mg via INTRAVENOUS

## 2018-09-01 MED ORDER — SCOPOLAMINE 1 MG/3DAYS TD PT72: 1.0000 | MEDICATED_PATCH | Freq: Once | TRANSDERMAL | Status: DC | PRN

## 2018-09-01 MED ORDER — ROPIVACAINE HCL 5 MG/ML IJ SOLN
INTRAMUSCULAR | Status: DC | PRN
  Administered 2018-09-01: 10 mL via PERINEURAL
  Administered 2018-09-01: 30 mL via PERINEURAL

## 2018-09-01 MED ORDER — PROPOFOL 500 MG/50ML IV EMUL
INTRAVENOUS | Status: AC
  Filled 2018-09-01: qty 50

## 2018-09-01 MED ORDER — BUPIVACAINE HCL (PF) 0.5 % IJ SOLN
INTRAMUSCULAR | Status: AC
  Filled 2018-09-01: qty 30

## 2018-09-01 MED ORDER — VANCOMYCIN HCL 500 MG IV SOLR
INTRAVENOUS | Status: AC
  Filled 2018-09-01: qty 500

## 2018-09-01 MED ORDER — MIDAZOLAM HCL 2 MG/2ML IJ SOLN
1.0000 mg | INTRAMUSCULAR | Status: DC | PRN
  Administered 2018-09-01: 2 mg via INTRAVENOUS

## 2018-09-01 MED ORDER — BUPIVACAINE-EPINEPHRINE (PF) 0.25% -1:200000 IJ SOLN
INTRAMUSCULAR | Status: AC
  Filled 2018-09-01: qty 30

## 2018-09-01 MED ORDER — DEXAMETHASONE SODIUM PHOSPHATE 4 MG/ML IJ SOLN
INTRAMUSCULAR | Status: DC | PRN
  Administered 2018-09-01: 5 mg via INTRAVENOUS

## 2018-09-01 MED ORDER — ONDANSETRON HCL 4 MG/2ML IJ SOLN
INTRAMUSCULAR | Status: DC | PRN
  Administered 2018-09-01: 4 mg via INTRAVENOUS

## 2018-09-01 MED ORDER — HEMOSTATIC AGENTS (NO CHARGE) OPTIME
TOPICAL | Status: DC | PRN
  Administered 2018-09-01: 1 via TOPICAL

## 2018-09-01 MED ORDER — CHLORHEXIDINE GLUCONATE 4 % EX LIQD: 60.0000 mL | Freq: Once | CUTANEOUS | Status: DC

## 2018-09-01 MED ORDER — ONDANSETRON HCL 4 MG/2ML IJ SOLN: 4.0000 mg | Freq: Once | INTRAMUSCULAR | Status: DC | PRN

## 2018-09-01 MED ORDER — LIDOCAINE 2% (20 MG/ML) 5 ML SYRINGE
INTRAMUSCULAR | Status: AC
  Filled 2018-09-01: qty 5

## 2018-09-01 MED ORDER — VANCOMYCIN HCL 500 MG IV SOLR
INTRAVENOUS | Status: DC | PRN
  Administered 2018-09-01: 500 mg via TOPICAL

## 2018-09-01 MED ORDER — CEFAZOLIN SODIUM-DEXTROSE 2-4 GM/100ML-% IV SOLN
INTRAVENOUS | Status: AC
  Filled 2018-09-01: qty 100

## 2018-09-01 MED ORDER — CEFAZOLIN SODIUM-DEXTROSE 2-4 GM/100ML-% IV SOLN
2.0000 g | INTRAVENOUS | Status: AC
  Administered 2018-09-01: 2 g via INTRAVENOUS

## 2018-09-01 MED ORDER — LIDOCAINE 2% (20 MG/ML) 5 ML SYRINGE
INTRAMUSCULAR | Status: DC | PRN
  Administered 2018-09-01: 80 mg via INTRAVENOUS

## 2018-09-01 MED ORDER — FENTANYL CITRATE (PF) 100 MCG/2ML IJ SOLN: 25.0000 ug | INTRAMUSCULAR | Status: DC | PRN

## 2018-09-01 MED ORDER — ONDANSETRON HCL 4 MG/2ML IJ SOLN
INTRAMUSCULAR | Status: AC
  Filled 2018-09-01: qty 2

## 2018-09-01 MED ORDER — SODIUM CHLORIDE 0.9 % IV SOLN: INTRAVENOUS | Status: DC

## 2018-09-01 SURGICAL SUPPLY — 72 items
BANDAGE ACE 4X5 VEL STRL LF (GAUZE/BANDAGES/DRESSINGS) ×3 IMPLANT
BANDAGE ESMARK 6X9 LF (GAUZE/BANDAGES/DRESSINGS) IMPLANT
BLADE AVERAGE 25MMX9MM (BLADE) ×1
BLADE AVERAGE 25X9 (BLADE) ×2 IMPLANT
BLADE MINI RND TIP GREEN BEAV (BLADE) IMPLANT
BLADE OSC/SAG .038X5.5 CUT EDG (BLADE) IMPLANT
BLADE SURG 15 STRL LF DISP TIS (BLADE) ×2 IMPLANT
BLADE SURG 15 STRL SS (BLADE) ×4
BNDG COHESIVE 4X5 TAN STRL (GAUZE/BANDAGES/DRESSINGS) IMPLANT
BNDG COHESIVE 6X5 TAN STRL LF (GAUZE/BANDAGES/DRESSINGS) IMPLANT
BNDG ESMARK 4X9 LF (GAUZE/BANDAGES/DRESSINGS) IMPLANT
BNDG ESMARK 6X9 LF (GAUZE/BANDAGES/DRESSINGS)
BOOT STEPPER DURA LG (SOFTGOODS) ×3 IMPLANT
BOOT STEPPER DURA MED (SOFTGOODS) IMPLANT
CANISTER SUCT 1200ML W/VALVE (MISCELLANEOUS) ×3 IMPLANT
CHLORAPREP W/TINT 26ML (MISCELLANEOUS) ×3 IMPLANT
COVER BACK TABLE 60X90IN (DRAPES) ×3 IMPLANT
COVER WAND RF STERILE (DRAPES) IMPLANT
CUFF TOURNIQUET SINGLE 24IN (TOURNIQUET CUFF) IMPLANT
CUFF TOURNIQUET SINGLE 34IN LL (TOURNIQUET CUFF) ×3 IMPLANT
DRAPE EXTREMITY T 121X128X90 (DRAPE) ×3 IMPLANT
DRAPE OEC MINIVIEW 54X84 (DRAPES) IMPLANT
DRAPE SURG 17X23 STRL (DRAPES) IMPLANT
DRAPE U-SHAPE 47X51 STRL (DRAPES) ×3 IMPLANT
DRSG MEPITEL 4X7.2 (GAUZE/BANDAGES/DRESSINGS) ×3 IMPLANT
DRSG PAD ABDOMINAL 8X10 ST (GAUZE/BANDAGES/DRESSINGS) ×3 IMPLANT
ELECT REM PT RETURN 9FT ADLT (ELECTROSURGICAL) ×3
ELECTRODE REM PT RTRN 9FT ADLT (ELECTROSURGICAL) ×1 IMPLANT
GAUZE SPONGE 4X4 12PLY STRL (GAUZE/BANDAGES/DRESSINGS) ×3 IMPLANT
GLOVE BIO SURGEON STRL SZ8 (GLOVE) ×3 IMPLANT
GLOVE BIOGEL PI IND STRL 7.0 (GLOVE) ×2 IMPLANT
GLOVE BIOGEL PI IND STRL 8 (GLOVE) ×2 IMPLANT
GLOVE BIOGEL PI INDICATOR 7.0 (GLOVE) ×4
GLOVE BIOGEL PI INDICATOR 8 (GLOVE) ×4
GLOVE ECLIPSE 6.5 STRL STRAW (GLOVE) ×3 IMPLANT
GLOVE ECLIPSE 8.0 STRL XLNG CF (GLOVE) ×3 IMPLANT
GOWN STRL REUS W/ TWL LRG LVL3 (GOWN DISPOSABLE) ×1 IMPLANT
GOWN STRL REUS W/ TWL XL LVL3 (GOWN DISPOSABLE) ×2 IMPLANT
GOWN STRL REUS W/TWL LRG LVL3 (GOWN DISPOSABLE) ×2
GOWN STRL REUS W/TWL XL LVL3 (GOWN DISPOSABLE) ×4
K-WIRE DBL END .054 LG (WIRE) IMPLANT
NEEDLE HYPO 22GX1.5 SAFETY (NEEDLE) ×3 IMPLANT
NEEDLE HYPO 25X1 1.5 SAFETY (NEEDLE) IMPLANT
NS IRRIG 1000ML POUR BTL (IV SOLUTION) ×3 IMPLANT
PACK BASIN DAY SURGERY FS (CUSTOM PROCEDURE TRAY) ×3 IMPLANT
PAD CAST 4YDX4 CTTN HI CHSV (CAST SUPPLIES) ×1 IMPLANT
PADDING CAST COTTON 4X4 STRL (CAST SUPPLIES) ×2
PADDING CAST COTTON 6X4 STRL (CAST SUPPLIES) IMPLANT
PENCIL BUTTON HOLSTER BLD 10FT (ELECTRODE) ×3 IMPLANT
SANITIZER HAND PURELL 535ML FO (MISCELLANEOUS) ×3 IMPLANT
SHEET MEDIUM DRAPE 40X70 STRL (DRAPES) ×3 IMPLANT
SLEEVE SCD COMPRESS KNEE MED (MISCELLANEOUS) ×3 IMPLANT
SPLINT FAST PLASTER 5X30 (CAST SUPPLIES)
SPLINT PLASTER CAST FAST 5X30 (CAST SUPPLIES) IMPLANT
SPONGE LAP 18X18 RF (DISPOSABLE) ×3 IMPLANT
SPONGE SURGIFOAM ABS GEL 12-7 (HEMOSTASIS) ×3 IMPLANT
STOCKINETTE 6  STRL (DRAPES) ×2
STOCKINETTE 6 STRL (DRAPES) ×1 IMPLANT
SUCTION FRAZIER HANDLE 10FR (MISCELLANEOUS) ×2
SUCTION TUBE FRAZIER 10FR DISP (MISCELLANEOUS) ×1 IMPLANT
SUT ETHILON 3 0 PS 1 (SUTURE) ×3 IMPLANT
SUT MNCRL AB 3-0 PS2 18 (SUTURE) ×3 IMPLANT
SUT VIC AB 0 SH 27 (SUTURE) IMPLANT
SUT VIC AB 2-0 SH 27 (SUTURE)
SUT VIC AB 2-0 SH 27XBRD (SUTURE) IMPLANT
SYR BULB 3OZ (MISCELLANEOUS) ×3 IMPLANT
SYR CONTROL 10ML LL (SYRINGE) IMPLANT
TOWEL GREEN STERILE FF (TOWEL DISPOSABLE) ×6 IMPLANT
TUBE CONNECTING 20'X1/4 (TUBING) ×1
TUBE CONNECTING 20X1/4 (TUBING) ×2 IMPLANT
UNDERPAD 30X30 (UNDERPADS AND DIAPERS) ×3 IMPLANT
YANKAUER SUCT BULB TIP NO VENT (SUCTIONS) ×3 IMPLANT

## 2018-09-01 NOTE — Anesthesia Procedure Notes (Signed)
Anesthesia Regional Block: Popliteal block   Pre-Anesthetic Checklist: ,, timeout performed, Correct Patient, Correct Site, Correct Laterality, Correct Procedure, Correct Position, site marked, Risks and benefits discussed,  Surgical consent,  Pre-op evaluation,  At surgeon's request and post-op pain management  Laterality: Right  Prep: chloraprep       Needles:  Injection technique: Single-shot  Needle Type: Echogenic Needle     Needle Length: 9cm  Needle Gauge: 21     Additional Needles:   Procedures:,,,, ultrasound used (permanent image in chart),,,,  Narrative:  Start time: 09/01/2018 7:04 AM End time: 09/01/2018 7:09 AM Injection made incrementally with aspirations every 5 mL.  Performed by: Personally  Anesthesiologist: Catalina Gravel, MD  Additional Notes: No pain on injection. No increased resistance to injection. Injection made in 5cc increments.  Good needle visualization.  Patient tolerated procedure well.

## 2018-09-01 NOTE — Anesthesia Procedure Notes (Signed)
Anesthesia Regional Block: Adductor canal block   Pre-Anesthetic Checklist: ,, timeout performed, Correct Patient, Correct Site, Correct Laterality, Correct Procedure, Correct Position, site marked, Risks and benefits discussed,  Surgical consent,  Pre-op evaluation,  At surgeon's request and post-op pain management  Laterality: Right  Prep: chloraprep       Needles:  Injection technique: Single-shot  Needle Type: Echogenic Needle     Needle Length: 9cm  Needle Gauge: 21     Additional Needles:   Procedures:,,,, ultrasound used (permanent image in chart),,,,  Narrative:  Start time: 09/01/2018 7:08 AM End time: 09/01/2018 7:13 AM Injection made incrementally with aspirations every 5 mL.  Performed by: Personally  Anesthesiologist: Catalina Gravel, MD  Additional Notes: No pain on injection. No increased resistance to injection. Injection made in 5cc increments.  Good needle visualization.  Patient tolerated procedure well.

## 2018-09-01 NOTE — Anesthesia Procedure Notes (Signed)
Procedure Name: LMA Insertion Performed by: Corrado Hymon W, CRNA Pre-anesthesia Checklist: Patient identified, Emergency Drugs available, Suction available and Patient being monitored Patient Re-evaluated:Patient Re-evaluated prior to induction Oxygen Delivery Method: Circle system utilized Preoxygenation: Pre-oxygenation with 100% oxygen Induction Type: IV induction Ventilation: Mask ventilation without difficulty LMA: LMA inserted LMA Size: 4.0 Number of attempts: 1 Placement Confirmation: positive ETCO2 Tube secured with: Tape Dental Injury: Teeth and Oropharynx as per pre-operative assessment        

## 2018-09-01 NOTE — Anesthesia Preprocedure Evaluation (Addendum)
Anesthesia Evaluation  Patient identified by MRN, date of birth, ID band Patient awake    Reviewed: Allergy & Precautions, NPO status , Patient's Chart, lab work & pertinent test results  Airway Mallampati: II  TM Distance: >3 FB Neck ROM: Full    Dental  (+) Dental Advisory Given, Caps   Pulmonary asthma ,    Pulmonary exam normal breath sounds clear to auscultation       Cardiovascular Exercise Tolerance: Good hypertension, Pt. on medications Normal cardiovascular exam Rhythm:Regular Rate:Normal     Neuro/Psych negative neurological ROS     GI/Hepatic negative GI ROS, Neg liver ROS,   Endo/Other  diabetes, Type 2, Oral Hypoglycemic Agents  Renal/GU negative Renal ROS     Musculoskeletal  (+) Arthritis , right foot charcot deformity and exostosis   Abdominal   Peds  Hematology negative hematology ROS (+)   Anesthesia Other Findings Day of surgery medications reviewed with the patient.  Reproductive/Obstetrics                           Anesthesia Physical Anesthesia Plan  ASA: II  Anesthesia Plan: General   Post-op Pain Management:  Regional for Post-op pain   Induction: Intravenous  PONV Risk Score and Plan: 2 and Dexamethasone and Ondansetron  Airway Management Planned: LMA  Additional Equipment:   Intra-op Plan:   Post-operative Plan: Extubation in OR  Informed Consent: I have reviewed the patients History and Physical, chart, labs and discussed the procedure including the risks, benefits and alternatives for the proposed anesthesia with the patient or authorized representative who has indicated his/her understanding and acceptance.   Dental advisory given  Plan Discussed with: CRNA  Anesthesia Plan Comments:         Anesthesia Quick Evaluation

## 2018-09-01 NOTE — Discharge Instructions (Addendum)
John Simmer, MD [facility]  Please read the following information regarding your care after surgery.  Medications  You only need a prescription for the narcotic pain medicine (ex. oxycodone, Percocet, Norco).  All of the other medicines listed below are available over the counter. X Aleve 2 pills twice a day for the first 3 days after surgery. X acetominophen (Tylenol) 650 mg every 4-6 hours as you need for minor to moderate pain  Weight Bearing X Bear weight only on your operated foot in the CAM boot.   Cast / Splint / Dressing X Keep your splint, cast or dressing clean and dry.  Dont put anything (coat hanger, pencil, etc) down inside of it.  If it gets damp, use a hair dryer on the cool setting to dry it.  If it gets soaked, call the office to schedule an appointment for a cast change.   After your dressing, cast or splint is removed; you may shower, but do not soak or scrub the wound.  Allow the water to run over it, and then gently pat it dry.  Swelling It is normal for you to have swelling where you had surgery.  To reduce swelling and pain, keep your toes above your nose for at least 3 days after surgery.  It may be necessary to keep your foot or leg elevated for several weeks.  If it hurts, it should be elevated.  Follow Up Call my office at [phone] when you are discharged from the hospital or surgery center to schedule an appointment to be seen two weeks after surgery.  Call my office at [phone] if you develop a fever >101.5 F, nausea, vomiting, bleeding from the surgical site or severe pain.      Post Anesthesia Home Care Instructions  Activity: Get plenty of rest for the remainder of the day. A responsible individual must stay with you for 24 hours following the procedure.  For the next 24 hours, DO NOT: -Drive a car -Paediatric nurse -Drink alcoholic beverages -Take any medication unless instructed by your physician -Make any legal  decisions or sign important papers.  Meals: Start with liquid foods such as gelatin or soup. Progress to regular foods as tolerated. Avoid greasy, spicy, heavy foods. If nausea and/or vomiting occur, drink only clear liquids until the nausea and/or vomiting subsides. Call your physician if vomiting continues.  Special Instructions/Symptoms: Your throat may feel dry or sore from the anesthesia or the breathing tube placed in your throat during surgery. If this causes discomfort, gargle with warm salt water. The discomfort should disappear within 24 hours.  If you had a scopolamine patch placed behind your ear for the management of post- operative nausea and/or vomiting:  1. The medication in the patch is effective for 72 hours, after which it should be removed.  Wrap patch in a tissue and discard in the trash. Wash hands thoroughly with soap and water. 2. You may remove the patch earlier than 72 hours if you experience unpleasant side effects which may include dry mouth, dizziness or visual disturbances. 3. Avoid touching the patch. Wash your hands with soap and water after contact with the patch.   Regional Anesthesia Blocks  1. Numbness or the inability to move the "blocked" extremity may last from 3-48 hours after placement. The length of time depends on the medication injected and your individual response to the medication. If the numbness is not going away after 48 hours, call your surgeon.  2. The extremity that  is blocked will need to be protected until the numbness is gone and the  Strength has returned. Because you cannot feel it, you will need to take extra care to avoid injury. Because it may be weak, you may have difficulty moving it or using it. You may not know what position it is in without looking at it while the block is in effect.  3. For blocks in the legs and feet, returning to weight bearing and walking needs to be done carefully. You will need to wait until the numbness is  entirely gone and the strength has returned. You should be able to move your leg and foot normally before you try and bear weight or walk. You will need someone to be with you when you first try to ensure you do not fall and possibly risk injury.  4. Bruising and tenderness at the needle site are common side effects and will resolve in a few days.  5. Persistent numbness or new problems with movement should be communicated to the surgeon or the [facility] [phone] [place] [phone]).Call your surgeon if you experience:   1.  Fever over 101.0. 2.  Inability to urinate. 3.  Nausea and/or vomiting. 4.  Extreme swelling or bruising at the surgical site. 5.  Continued bleeding from the incision. 6.  Increased pain, redness or drainage from the incision. 7.  Problems related to your pain medication. 8.  Any problems and/or concerns

## 2018-09-01 NOTE — H&P (Signed)
John Miranda is an 67 y.o. male.   Chief Complaint: right foot pain HPI: The patient is a 67 year old male with a past medical history significant for Charcot neuroarthropathy of his right foot.  He has a bony prominence at the plantar aspect of the cuboid.  He presents today for resection of the prominent bone having failed nonoperative treatment to date.  Past Medical History:  Diagnosis Date  . Arthritis   . Asthma   . Charcot's joint of foot 11/2014   right foot  . Diabetes mellitus without complication [facility])     Past Surgical History:  Procedure Laterality Date  . [place]  . FINGER NAIL SURGERY Right 08/2014   index finger-local  . SINOSCOPY    . TOTAL KNEE ARTHROPLASTY Left 09/02/2015   Procedure: TOTAL LEFT KNEE ARTHROPLASTY;  Surgeon: Vickey Huger, MD;  Location: [facility];  Service: Orthopedics;  Laterality: Left;    History reviewed. No pertinent family history. Social History:  reports that he has never smoked. He has never used smokeless tobacco. He reports that he does not drink alcohol or use drugs.  Allergies: No Known Allergies  Medications Prior to Admission  Medication Sig Dispense Refill  . enalapril (VASOTEC) 10 MG tablet Take 10 mg by mouth daily.  2  . glimepiride (AMARYL) 4 MG tablet Take 1 tablet by mouth every morning.  6  . JANUMET XR 50-1000 MG TB24 Take 2 tablets by mouth every evening.  4  . albuterol (PROAIR HFA) 108 (90 Base) MCG/ACT inhaler Inhale two puffs every four to six hours as needed for cough or wheeze. 3 Inhaler 0    Results for orders placed or performed during the hospital encounter of 09/01/18 (from the past 48 hour(s))  Basic metabolic panel     Status: Abnormal   Collection Time: 08/30/18 11:00 AM  Result Value Ref Range   Sodium 137 135 - 145 mmol/L   Potassium 4.6 3.5 - 5.1 mmol/L   Chloride 103 98 - 111 mmol/L   CO2 26 22 - 32 mmol/L   Glucose, Bld 175 (H) 70 - 99 mg/dL   BUN 24 (H) 8 - 23 mg/dL   Creatinine,  Ser 1.16 0.61 - 1.24 mg/dL   Calcium 9.6 8.9 - 10.3 mg/dL   GFR calc non Af Amer >60 >60 mL/min   GFR calc Af Amer >60 >60 mL/min    Comment: (NOTE) The eGFR has been calculated using the CKD EPI equation. This calculation has not been validated in all clinical situations. eGFR's persistently <60 mL/min signify possible Chronic Kidney Disease.    Anion gap 8 5 - 15    Comment: Performed at [facility] [address] [place], [place] [address]  Glucose, capillary     Status: Abnormal   Collection Time: 09/01/18  6:52 AM  Result Value Ref Range   Glucose-Capillary 103 (H) 70 - 99 mg/dL   No results found.  ROS no recent fever, chills, nausea, vomiting or changes in his appetite  Blood pressure (!) 155/78, pulse 79, temperature 98 F (36.7 C), temperature source Oral, resp. rate (!) 22, height 5' 10" (1.778 m), weight 93.3 kg, SpO2 97 %. Physical Exam  Well-nourished well-developed man in no apparent distress.  Alert and oriented x4.  Mood and affect are normal.  Extraocular motions are intact.  Respirations are unlabored.  Gait is normal.  The right foot has a prominence beneath the cuboid with overlying callus.  Skin is otherwise healthy and intact.  Diminished sensibility to light touch throughout the right foot.  5 out of 5 strength in plantar flexion and dorsiflexion of the ankle and toes.  No lymphadenopathy.  Brisk capillary refill of the toes.  Assessment/Plan Right foot plantar exostosis -to the OR today for surgical excision of the prominent bone.  The risks and benefits of the alternative treatment options have been discussed in detail.  The patient wishes to proceed with surgery and specifically understands risks of bleeding, infection, nerve damage, blood clots, need for additional surgery, amputation and death.   Wylene Simmer, MD 09/14/2018, 7:12 AM

## 2018-09-01 NOTE — Op Note (Signed)
09/01/2018  8:21 AM  PATIENT:  [name]  67 y.o. male  PRE-OPERATIVE DIAGNOSIS:  right foot charcot deformity and plantar exostosis  POST-OPERATIVE DIAGNOSIS:  right foot charcot deformity and plantar exostosis  Procedure(s):  Right foot cuboid exostectomy  SURGEON:  Wylene Simmer, MD  ASSISTANT: Mechele Claude, PA-C  ANESTHESIA:   General, regional  EBL:  minimal   TOURNIQUET:   Total Tourniquet Time Documented: Thigh (Right) - 25 minutes Total: Thigh (Right) - 25 minutes  COMPLICATIONS:  None apparent  DISPOSITION:  Extubated, awake and stable to recovery.  INDICATION FOR PROCEDURE: The patient is a 67 year old male with a past medical history significant for diabetes complicated by right foot Charcot neuroarthropathy.  He has developed a plantar exostosis at the midfoot underneath the cuboid.  He has failed nonoperative treatment to date with activity modification and custom orthotics.  He presents today for operative resection of this prominent exostosis.  The risks and benefits of the alternative treatment options have been discussed in detail.  The patient wishes to proceed with surgery and specifically understands risks of bleeding, infection, nerve damage, blood clots, need for additional surgery, amputation and death.    PROCEDURE IN DETAIL:  After pre operative consent was obtained, and the correct operative site was identified, the patient was brought to the operating room and placed supine on the OR table.  Anesthesia was administered.  Pre-operative antibiotics were administered.  A surgical timeout was taken.  The right lower extremity was prepped and draped in standard sterile fashion with a tourniquet around the thigh.  The extremity was elevated and the tourniquet was inflated to 250 mmHg.  A longitudinal incision was made along the lateral border of the foot.  Dissection was carried down through the subcutaneous tissues.  Deep dissection was then carried along the  plantar surface of the cuboid with an elevator.  The periosteum was incised.  The oscillating saw was used to cut through the plantar surface of the cuboid removing the exostosis.  The bone fragments were then freed from the surrounding soft tissues and removed.  Palpation along the plantar surface of the foot showed appropriate resection.  The wound was irrigated copiously.  Gelfoam sponge was placed against the cut surface of cuboid.  Vancomycin powder was sprinkled in the wound.  The wound was then closed with horizontal mattress sutures of 3-0 nylon.  Sterile dressings were applied followed by a compression wrap and a cam boot.  The tourniquet was released after application of the dressings.  The patient was awakened from anesthesia and transported to the recovery room in stable condition.   FOLLOW UP PLAN: Weightbearing as tolerated in a cam boot on his heel.  Follow-up in the office in 2 weeks for suture removal.    Mechele Claude PA-C was present and scrubbed for the duration of the operative case. His assistance was essential in positioning the patient, prepping and draping, gaining and maintaining exposure, performing the operation, closing and dressing the wounds and applying the splint.

## 2018-09-01 NOTE — Anesthesia Postprocedure Evaluation (Signed)
Anesthesia Post Note  Patient: John Miranda  Procedure(s) Performed: Right foot cuboid exostectomy (Right Foot)     Patient location during evaluation: PACU Anesthesia Type: General Level of consciousness: awake and alert, oriented and awake Pain management: pain level controlled Vital Signs Assessment: post-procedure vital signs reviewed and stable Respiratory status: spontaneous breathing, nonlabored ventilation and respiratory function stable Cardiovascular status: blood pressure returned to baseline and stable Postop Assessment: no apparent nausea or vomiting Anesthetic complications: no    Last Vitals:  Vitals:   09/01/18 0845 09/01/18 0929  BP: (!) 161/87 (!) 152/81  Pulse: 67 65  Resp: 14 16  Temp:  36.6 C  SpO2: 96% 96%    Last Pain:  Vitals:   09/01/18 0915  TempSrc:   PainSc: 0-No pain                 Catalina Gravel

## 2018-09-01 NOTE — Transfer of Care (Signed)
Immediate Anesthesia Transfer of Care Note  Patient: John Miranda  Procedure(s) Performed: Right foot cuboid exostectomy (Right Foot)  Patient Location: PACU  Anesthesia Type:General and Regional  Level of Consciousness: awake and sedated  Airway & Oxygen Therapy: Patient Spontanous Breathing and Patient connected to face mask oxygen  Post-op Assessment: Report given to RN and Post -op Vital signs reviewed and stable  Post vital signs: Reviewed and stable  Last Vitals:  Vitals Value Taken Time  BP 164/97 09/01/2018  8:19 AM  Temp    Pulse 78 09/01/2018  8:21 AM  Resp 16 09/01/2018  8:21 AM  SpO2 98 % 09/01/2018  8:21 AM  Vitals shown include unvalidated device data.  Last Pain:  Vitals:   09/01/18 0706  TempSrc:   PainSc: 0-No pain         Complications: No apparent anesthesia complications

## 2018-09-01 NOTE — Progress Notes (Signed)
Assisted Dr. Turk with right, ultrasound guided, popliteal/saphenous block. Side rails up, monitors on throughout procedure. See vital signs in flow sheet. Tolerated Procedure well. 

## 2018-09-02 ENCOUNTER — Encounter (HOSPITAL_BASED_OUTPATIENT_CLINIC_OR_DEPARTMENT_OTHER): Payer: Self-pay | Admitting: Orthopedic Surgery

## 2019-05-15 ENCOUNTER — Other Ambulatory Visit: Payer: Self-pay

## 2019-07-17 DIAGNOSIS — E1169 Type 2 diabetes mellitus with other specified complication: Secondary | ICD-10-CM | POA: Diagnosis not present

## 2019-07-24 DIAGNOSIS — E7800 Pure hypercholesterolemia, unspecified: Secondary | ICD-10-CM | POA: Diagnosis not present

## 2019-07-24 DIAGNOSIS — Z1331 Encounter for screening for depression: Secondary | ICD-10-CM | POA: Diagnosis not present

## 2019-07-24 DIAGNOSIS — Z23 Encounter for immunization: Secondary | ICD-10-CM | POA: Diagnosis not present

## 2019-07-24 DIAGNOSIS — E785 Hyperlipidemia, unspecified: Secondary | ICD-10-CM | POA: Diagnosis not present

## 2019-07-24 DIAGNOSIS — R809 Proteinuria, unspecified: Secondary | ICD-10-CM | POA: Diagnosis not present

## 2019-07-24 DIAGNOSIS — E1169 Type 2 diabetes mellitus with other specified complication: Secondary | ICD-10-CM | POA: Diagnosis not present

## 2019-07-24 DIAGNOSIS — E1129 Type 2 diabetes mellitus with other diabetic kidney complication: Secondary | ICD-10-CM | POA: Diagnosis not present

## 2019-07-24 DIAGNOSIS — Z6828 Body mass index (BMI) 28.0-28.9, adult: Secondary | ICD-10-CM | POA: Diagnosis not present

## 2019-10-07 DIAGNOSIS — Z20828 Contact with and (suspected) exposure to other viral communicable diseases: Secondary | ICD-10-CM | POA: Diagnosis not present

## 2019-10-07 DIAGNOSIS — J3489 Other specified disorders of nose and nasal sinuses: Secondary | ICD-10-CM | POA: Diagnosis not present

## 2020-02-14 DIAGNOSIS — E1169 Type 2 diabetes mellitus with other specified complication: Secondary | ICD-10-CM | POA: Diagnosis not present

## 2020-02-14 DIAGNOSIS — Z125 Encounter for screening for malignant neoplasm of prostate: Secondary | ICD-10-CM | POA: Diagnosis not present

## 2020-02-19 DIAGNOSIS — Z6828 Body mass index (BMI) 28.0-28.9, adult: Secondary | ICD-10-CM | POA: Diagnosis not present

## 2020-02-19 DIAGNOSIS — E785 Hyperlipidemia, unspecified: Secondary | ICD-10-CM | POA: Diagnosis not present

## 2020-02-19 DIAGNOSIS — E1169 Type 2 diabetes mellitus with other specified complication: Secondary | ICD-10-CM | POA: Diagnosis not present

## 2020-02-19 DIAGNOSIS — R809 Proteinuria, unspecified: Secondary | ICD-10-CM | POA: Diagnosis not present

## 2020-02-19 DIAGNOSIS — L308 Other specified dermatitis: Secondary | ICD-10-CM | POA: Diagnosis not present

## 2020-02-19 DIAGNOSIS — E11610 Type 2 diabetes mellitus with diabetic neuropathic arthropathy: Secondary | ICD-10-CM | POA: Diagnosis not present

## 2020-02-19 DIAGNOSIS — E7800 Pure hypercholesterolemia, unspecified: Secondary | ICD-10-CM | POA: Diagnosis not present

## 2020-02-19 DIAGNOSIS — E1129 Type 2 diabetes mellitus with other diabetic kidney complication: Secondary | ICD-10-CM | POA: Diagnosis not present

## 2020-05-07 ENCOUNTER — Other Ambulatory Visit: Payer: Self-pay

## 2020-05-29 DIAGNOSIS — E11621 Type 2 diabetes mellitus with foot ulcer: Secondary | ICD-10-CM | POA: Diagnosis not present

## 2020-05-29 DIAGNOSIS — M79671 Pain in right foot: Secondary | ICD-10-CM | POA: Diagnosis not present

## 2020-06-10 DIAGNOSIS — Z1331 Encounter for screening for depression: Secondary | ICD-10-CM | POA: Diagnosis not present

## 2020-06-10 DIAGNOSIS — Z6828 Body mass index (BMI) 28.0-28.9, adult: Secondary | ICD-10-CM | POA: Diagnosis not present

## 2020-06-10 DIAGNOSIS — Z9181 History of falling: Secondary | ICD-10-CM | POA: Diagnosis not present

## 2020-06-10 DIAGNOSIS — Z1339 Encounter for screening examination for other mental health and behavioral disorders: Secondary | ICD-10-CM | POA: Diagnosis not present

## 2020-06-10 DIAGNOSIS — L97509 Non-pressure chronic ulcer of other part of unspecified foot with unspecified severity: Secondary | ICD-10-CM | POA: Diagnosis not present

## 2020-06-10 DIAGNOSIS — E11621 Type 2 diabetes mellitus with foot ulcer: Secondary | ICD-10-CM | POA: Diagnosis not present

## 2020-06-10 DIAGNOSIS — E11610 Type 2 diabetes mellitus with diabetic neuropathic arthropathy: Secondary | ICD-10-CM | POA: Diagnosis not present

## 2020-06-10 DIAGNOSIS — Z0000 Encounter for general adult medical examination without abnormal findings: Secondary | ICD-10-CM | POA: Diagnosis not present

## 2020-06-20 DIAGNOSIS — E11621 Type 2 diabetes mellitus with foot ulcer: Secondary | ICD-10-CM | POA: Diagnosis not present

## 2020-07-18 DIAGNOSIS — Z23 Encounter for immunization: Secondary | ICD-10-CM | POA: Diagnosis not present

## 2020-07-18 DIAGNOSIS — E11621 Type 2 diabetes mellitus with foot ulcer: Secondary | ICD-10-CM | POA: Diagnosis not present

## 2020-07-31 DIAGNOSIS — J40 Bronchitis, not specified as acute or chronic: Secondary | ICD-10-CM | POA: Diagnosis not present

## 2020-07-31 DIAGNOSIS — J329 Chronic sinusitis, unspecified: Secondary | ICD-10-CM | POA: Diagnosis not present

## 2020-08-19 DIAGNOSIS — E11621 Type 2 diabetes mellitus with foot ulcer: Secondary | ICD-10-CM | POA: Diagnosis not present

## 2020-08-20 DIAGNOSIS — M1711 Unilateral primary osteoarthritis, right knee: Secondary | ICD-10-CM | POA: Diagnosis not present

## 2020-08-20 DIAGNOSIS — Z471 Aftercare following joint replacement surgery: Secondary | ICD-10-CM | POA: Diagnosis not present

## 2020-08-20 DIAGNOSIS — G8929 Other chronic pain: Secondary | ICD-10-CM | POA: Diagnosis not present

## 2020-08-20 DIAGNOSIS — Z96642 Presence of left artificial hip joint: Secondary | ICD-10-CM | POA: Diagnosis not present

## 2020-08-20 DIAGNOSIS — Z96652 Presence of left artificial knee joint: Secondary | ICD-10-CM | POA: Diagnosis not present

## 2020-08-20 DIAGNOSIS — M25561 Pain in right knee: Secondary | ICD-10-CM | POA: Diagnosis not present

## 2020-08-26 DIAGNOSIS — A5216 Charcot's arthropathy (tabetic): Secondary | ICD-10-CM | POA: Diagnosis not present

## 2020-08-26 DIAGNOSIS — E1169 Type 2 diabetes mellitus with other specified complication: Secondary | ICD-10-CM | POA: Diagnosis not present

## 2020-08-26 DIAGNOSIS — E7800 Pure hypercholesterolemia, unspecified: Secondary | ICD-10-CM | POA: Diagnosis not present

## 2020-08-26 DIAGNOSIS — E1142 Type 2 diabetes mellitus with diabetic polyneuropathy: Secondary | ICD-10-CM | POA: Diagnosis not present

## 2020-08-27 DIAGNOSIS — J40 Bronchitis, not specified as acute or chronic: Secondary | ICD-10-CM | POA: Diagnosis not present

## 2020-08-27 DIAGNOSIS — Z01818 Encounter for other preprocedural examination: Secondary | ICD-10-CM | POA: Diagnosis not present

## 2020-08-27 DIAGNOSIS — L97509 Non-pressure chronic ulcer of other part of unspecified foot with unspecified severity: Secondary | ICD-10-CM | POA: Diagnosis not present

## 2020-08-27 DIAGNOSIS — E11621 Type 2 diabetes mellitus with foot ulcer: Secondary | ICD-10-CM | POA: Diagnosis not present

## 2020-08-27 DIAGNOSIS — E785 Hyperlipidemia, unspecified: Secondary | ICD-10-CM | POA: Diagnosis not present

## 2020-08-27 DIAGNOSIS — E1169 Type 2 diabetes mellitus with other specified complication: Secondary | ICD-10-CM | POA: Diagnosis not present

## 2020-08-27 DIAGNOSIS — J329 Chronic sinusitis, unspecified: Secondary | ICD-10-CM | POA: Diagnosis not present

## 2020-08-27 DIAGNOSIS — R000 Tachycardia, unspecified: Secondary | ICD-10-CM | POA: Diagnosis not present

## 2020-08-27 DIAGNOSIS — Z6828 Body mass index (BMI) 28.0-28.9, adult: Secondary | ICD-10-CM | POA: Diagnosis not present

## 2020-09-02 ENCOUNTER — Ambulatory Visit: Payer: Medicare Other | Admitting: Cardiology

## 2020-09-02 ENCOUNTER — Other Ambulatory Visit: Payer: Self-pay

## 2020-09-02 DIAGNOSIS — R000 Tachycardia, unspecified: Secondary | ICD-10-CM | POA: Insufficient documentation

## 2020-09-02 DIAGNOSIS — M1990 Unspecified osteoarthritis, unspecified site: Secondary | ICD-10-CM | POA: Insufficient documentation

## 2020-09-02 DIAGNOSIS — E785 Hyperlipidemia, unspecified: Secondary | ICD-10-CM | POA: Insufficient documentation

## 2020-09-02 DIAGNOSIS — E1169 Type 2 diabetes mellitus with other specified complication: Secondary | ICD-10-CM | POA: Insufficient documentation

## 2020-09-02 DIAGNOSIS — Z87442 Personal history of urinary calculi: Secondary | ICD-10-CM | POA: Insufficient documentation

## 2020-09-02 DIAGNOSIS — K219 Gastro-esophageal reflux disease without esophagitis: Secondary | ICD-10-CM | POA: Insufficient documentation

## 2020-09-02 DIAGNOSIS — Z6828 Body mass index (BMI) 28.0-28.9, adult: Secondary | ICD-10-CM | POA: Insufficient documentation

## 2020-09-02 DIAGNOSIS — E11621 Type 2 diabetes mellitus with foot ulcer: Secondary | ICD-10-CM | POA: Insufficient documentation

## 2020-09-02 DIAGNOSIS — J40 Bronchitis, not specified as acute or chronic: Secondary | ICD-10-CM | POA: Insufficient documentation

## 2020-09-02 DIAGNOSIS — E119 Type 2 diabetes mellitus without complications: Secondary | ICD-10-CM | POA: Insufficient documentation

## 2020-09-02 DIAGNOSIS — J329 Chronic sinusitis, unspecified: Secondary | ICD-10-CM | POA: Insufficient documentation

## 2020-09-02 DIAGNOSIS — J45909 Unspecified asthma, uncomplicated: Secondary | ICD-10-CM | POA: Insufficient documentation

## 2020-09-03 ENCOUNTER — Encounter: Payer: Self-pay | Admitting: Cardiology

## 2020-09-03 ENCOUNTER — Ambulatory Visit: Payer: PPO | Admitting: Cardiology

## 2020-09-03 ENCOUNTER — Other Ambulatory Visit: Payer: Self-pay

## 2020-09-03 VITALS — BP 144/72 | HR 157 | Ht 71.0 in | Wt 206.2 lb

## 2020-09-03 DIAGNOSIS — R011 Cardiac murmur, unspecified: Secondary | ICD-10-CM | POA: Diagnosis not present

## 2020-09-03 DIAGNOSIS — R000 Tachycardia, unspecified: Secondary | ICD-10-CM

## 2020-09-03 DIAGNOSIS — E785 Hyperlipidemia, unspecified: Secondary | ICD-10-CM | POA: Diagnosis not present

## 2020-09-03 DIAGNOSIS — E1169 Type 2 diabetes mellitus with other specified complication: Secondary | ICD-10-CM

## 2020-09-03 DIAGNOSIS — Z01810 Encounter for preprocedural cardiovascular examination: Secondary | ICD-10-CM

## 2020-09-03 MED ORDER — METOPROLOL SUCCINATE ER 50 MG PO TB24: 50.0000 mg | ORAL_TABLET | Freq: Every day | ORAL | 3 refills | Status: AC

## 2020-09-03 NOTE — Patient Instructions (Addendum)
Medication Instructions:  Your physician has recommended you make the following change in your medication:   Stop Enalapril. Start metoprolol succinate (Toprol XL) 50 mg daily.  *If you need a refill on your cardiac medications before your next appointment, please call your pharmacy*   Lab Work: Your physician recommends that you have labs in the office today. Your labs included a free T3 and free T4.   If you have labs (blood work) drawn today and your tests are completely normal, you will receive your results only by: Marland Kitchen MyChart Message (if you have MyChart) OR . A paper copy in the mail If you have any lab test that is abnormal or we need to change your treatment, we will call you to review the results.   Testing/Procedures: Your physician has requested that you have an echocardiogram. Echocardiography is a painless test that uses sound waves to create images of your heart. It provides your doctor with information about the size and shape of your heart and how well your heart's chambers and valves are working. This procedure takes approximately one hour. There are no restrictions for this procedure.     Follow-Up: At [facility], you and your health needs are our priority.  As part of our continuing mission to provide you with exceptional heart care, we have created designated Provider Care Teams.  These Care Teams include your primary Cardiologist (physician) and Advanced Practice Providers (APPs -  Physician Assistants and Nurse Practitioners) who all work together to provide you with the care you need, when you need it.  We recommend signing up for the patient portal called "MyChart".  Sign up information is provided on this After Visit Summary.  MyChart is used to connect with patients for Virtual Visits (Telemedicine).  Patients are able to view lab/test results, encounter notes, upcoming appointments, etc.  Non-urgent messages can be sent to your provider as well.   To learn  more about what you can do with MyChart, go to [email or web address].    Your next appointment:   8-10 day(s)  The format for your next appointment:   In Person  Provider:   Jyl Heinz, MD   Other Instructions  Echocardiogram An echocardiogram is a procedure that uses painless sound waves (ultrasound) to produce an image of the heart. Images from an echocardiogram can provide important information about:  Signs of coronary artery disease (CAD).  Aneurysm detection. An aneurysm is a weak or damaged part of an artery wall that bulges out from the normal force of blood pumping through the body.  Heart size and shape. Changes in the size or shape of the heart can be associated with certain conditions, including heart failure, aneurysm, and CAD.  Heart muscle function.  Heart valve function.  Signs of a past heart attack.  Fluid buildup around the heart.  Thickening of the heart muscle.  A tumor or infectious growth around the heart valves. Tell a health care provider about:  Any allergies you have.  All medicines you are taking, including vitamins, herbs, eye drops, creams, and over-the-counter medicines.  Any blood disorders you have.  Any surgeries you have had.  Any medical conditions you have.  Whether you are pregnant or may be pregnant. What are the risks? Generally, this is a safe procedure. However, problems may occur, including:  Allergic reaction to dye (contrast) that may be used during the procedure. What happens before the procedure? No specific preparation is needed. You may eat and drink normally. What  happens during the procedure?   An IV tube may be inserted into one of your veins.  You may receive contrast through this tube. A contrast is an injection that improves the quality of the pictures from your heart.  A gel will be applied to your chest.  A wand-like tool (transducer) will be moved over your chest. The gel will help to  transmit the sound waves from the transducer.  The sound waves will harmlessly bounce off of your heart to allow the heart images to be captured in real-time motion. The images will be recorded on a computer. The procedure may vary among health care providers and hospitals. What happens after the procedure?  You may return to your normal, everyday life, including diet, activities, and medicines, unless your health care provider tells you not to do that. Summary  An echocardiogram is a procedure that uses painless sound waves (ultrasound) to produce an image of the heart.  Images from an echocardiogram can provide important information about the size and shape of your heart, heart muscle function, heart valve function, and fluid buildup around your heart.  You do not need to do anything to prepare before this procedure. You may eat and drink normally.  After the echocardiogram is completed, you may return to your normal, everyday life, unless your health care provider tells you not to do that. This information is not intended to replace advice given to you by your health care provider. Make sure you discuss any questions you have with your health care provider. Document Revised: 01/19/2019 Document Reviewed: 10/31/2016 Elsevier Patient Education  [place].

## 2020-09-03 NOTE — Progress Notes (Signed)
Cardiology Office Note:    Date:  09/03/2020   ID:  John Miranda, DOB [birth year], MRN [id number]  PCP:  Mateo Flow, MD  Cardiologist:  Jenean Lindau, MD   Referring MD: Mateo Flow, MD    ASSESSMENT:    1. Type 2 diabetes mellitus with hyperlipidemia ([place])   2. Sinus tachycardia   3. Preoperative cardiovascular examination   4. Cardiac murmur    PLAN:    In order of problems listed above:  1. Primary prevention stressed with the patient.  Importance of compliance with diet medication stressed and vocalized understanding. 2. Cardiac murmur: Echocardiogram will be done to assess murmur heard on auscultation. 3. Sinus tachycardia: Appears inappropriate at this time.  Recent TSH was unremarkable.  I will get other thyroid parameters such as T3 and free T3 to see if there is any issue with this.  He has brought some readings from home and his heart rate sometimes is in the 140s.  His wife is a retired Marine scientist.  In view of this I will stop her enalapril.  He was going to be need ACE inhibition in view of the fact that the patient is a diabetic but currently in view of his significant issues I will switch him to beta-blocker Toprol-XL 50 mg daily.  He understands this. 4. Diabetes mellitus: Managed by primary care physician. 5. Preoperative cardiovascular risk assessment: Based on the above seen in follow-up in 8 to 10 days and plan for risk assessment if necessary.  Patient had multiple questions which were answered to his satisfaction.   Medication Adjustments/Labs and Tests Ordered: Current medicines are reviewed at length with the patient today.  Concerns regarding medicines are outlined above.  No orders of the defined types were placed in this encounter.  No orders of the defined types were placed in this encounter.    History of Present Illness:    John Miranda is a 69 y.o. male who is being seen today for the evaluation of sinus tachycardia and preoperative risk  assessment.  At the request of Mateo Flow, MD.  It is a pleasant 69 year old male.  He has past medical history of diabetes mellitus.  Patient is a non-smoker.  He is planning to undergo knee replacement surgery.  He was noted to have significant tachycardia on evaluation and sent here for the same.  He denies any chest pain orthopnea or PND.  No history of any blood loss from any source of the body.  At the time of my evaluation, the patient is alert awake oriented and in no distress.  Past Medical History:  Diagnosis Date  . Acid reflux disease   . Arthritis   . Asthma   . BMI 28.0-28.9,adult   . Charcot's joint of foot 11/2014   right foot  . Chronic pain of both knees 08/16/2015  . Diabetes mellitus without complication ([place])   . Diabetic foot ulcer ([place])   . History of kidney stones   . Primary osteoarthritis of left knee 08/16/2015  . Sinobronchitis   . Sinus tachycardia   . Status post total left knee replacement 09/02/2015  . Type 2 diabetes mellitus with hyperlipidemia [facility])     Past Surgical History:  Procedure Laterality Date  . BONE EXOSTOSIS EXCISION Right 09/01/2018   Procedure: Right foot cuboid exostectomy;  Surgeon: Wylene Simmer, MD;  Location: [place];  Service: Orthopedics;  Laterality: Right;  59min  . [place]  .  FINGER NAIL SURGERY Right 08/2014   index finger-local  . SINOSCOPY    . TOTAL KNEE ARTHROPLASTY Left 09/02/2015   Procedure: TOTAL LEFT KNEE ARTHROPLASTY;  Surgeon: Vickey Huger, MD;  Location: [place];  Service: Orthopedics;  Laterality: Left;    Current Medications: Current Meds  Medication Sig  . albuterol (PROAIR HFA) 108 (90 Base) MCG/ACT inhaler Inhale two puffs every four to six hours as needed for cough or wheeze.  . benzonatate (TESSALON) 200 MG capsule Take 200 mg by mouth 3 (three) times daily as needed.  . doxycycline (MONODOX) 100 MG capsule Take 100 mg by mouth every 12 (twelve) hours.  . enalapril  (VASOTEC) 10 MG tablet Take 10 mg by mouth daily.  Marland Kitchen Fexofenadine HCl (ALLEGRA PO) Take by mouth.  Marland Kitchen glimepiride (AMARYL) 4 MG tablet Take 1 tablet by mouth every morning.  Marland Kitchen JANUMET XR 50-1000 MG TB24 Take 2 tablets by mouth every evening.  . loratadine (CLARITIN) 10 MG tablet Take 10 mg by mouth daily.  . rosuvastatin (CRESTOR) 5 MG tablet Take 5 mg by mouth daily.  Marland Kitchen triamcinolone (NASACORT) 55 MCG/ACT AERO nasal inhaler Place 2 sprays into the nose daily.     Allergies:   Hornet venom   Social History   Socioeconomic History  . Marital status: Married    Spouse name: Not on file  . Number of children: Not on file  . Years of education: Not on file  . Highest education level: Not on file  Occupational History  . Not on file  Tobacco Use  . Smoking status: Never Smoker  . Smokeless tobacco: Never Used  Vaping Use  . Vaping Use: Never used  Substance and Sexual Activity  . Alcohol use: No  . Drug use: No  . Sexual activity: Not on file  Other Topics Concern  . Not on file  Social History Narrative  . Not on file   Social Determinants of Health   Financial Resource Strain:   . Difficulty of Paying Living Expenses: Not on file  Food Insecurity:   . Worried About Charity fundraiser in the Last Year: Not on file  . Ran Out of Food in the Last Year: Not on file  Transportation Needs:   . Lack of Transportation (Medical): Not on file  . Lack of Transportation (Non-Medical): Not on file  Physical Activity:   . Days of Exercise per Week: Not on file  . Minutes of Exercise per Session: Not on file  Stress:   . Feeling of Stress : Not on file  Social Connections:   . Frequency of Communication with Friends and Family: Not on file  . Frequency of Social Gatherings with Friends and Family: Not on file  . Attends Religious Services: Not on file  . Active Member of Clubs or Organizations: Not on file  . Attends Archivist Meetings: Not on file  . Marital Status:  Not on file     Family History: The patient's family history is negative for Hypertension, Diabetes, Cancer, and Heart disease.  ROS:   Please see the history of present illness.    All other systems reviewed and are negative.  EKGs/Labs/Other Studies Reviewed:    The following studies were reviewed today: EKG reveals sinus rhythm with heart rate 157 and nonspecific ST-T changes   Recent Labs: No results found for requested labs within last 8760 hours.  Recent Lipid Panel No results found for: CHOL, TRIG, HDL, CHOLHDL, VLDL,  LDLCALC, LDLDIRECT  Physical Exam:    VS:  BP (!) 144/72   Pulse (!) 157   Ht 5\' 11"  (1.803 m)   Wt 206 lb 3.2 oz (93.5 kg)   SpO2 95%   BMI 28.76 kg/m     Wt Readings from Last 3 Encounters:  09/03/20 206 lb 3.2 oz (93.5 kg)  09/01/18 205 lb 11 oz (93.3 kg)  03/25/17 206 lb (93.4 kg)     GEN: Patient is in no acute distress HEENT: Normal NECK: No JVD; No carotid bruits LYMPHATICS: No lymphadenopathy CARDIAC: S1 S2 regular, 2/6 systolic murmur at the apex. RESPIRATORY:  Clear to auscultation without rales, wheezing or rhonchi  ABDOMEN: Soft, non-tender, non-distended MUSCULOSKELETAL:  No edema; No deformity  SKIN: Warm and dry NEUROLOGIC:  Alert and oriented x 3 PSYCHIATRIC:  Normal affect    Signed, Jenean Lindau, MD  09/03/2020 11:19 AM    [place]

## 2020-09-04 LAB — T3, FREE: T3, Free: 2.9 pg/mL (ref 2.0–4.4)

## 2020-09-04 LAB — T4, FREE: Free T4: 1.44 ng/dL (ref 0.82–1.77)

## 2020-09-08 DIAGNOSIS — E785 Hyperlipidemia, unspecified: Secondary | ICD-10-CM | POA: Diagnosis not present

## 2020-09-08 DIAGNOSIS — R000 Tachycardia, unspecified: Secondary | ICD-10-CM | POA: Diagnosis not present

## 2020-09-08 DIAGNOSIS — R9431 Abnormal electrocardiogram [ECG] [EKG]: Secondary | ICD-10-CM | POA: Diagnosis not present

## 2020-09-08 DIAGNOSIS — I4891 Unspecified atrial fibrillation: Secondary | ICD-10-CM | POA: Diagnosis not present

## 2020-09-08 DIAGNOSIS — R0602 Shortness of breath: Secondary | ICD-10-CM | POA: Diagnosis not present

## 2020-09-08 DIAGNOSIS — N200 Calculus of kidney: Secondary | ICD-10-CM | POA: Diagnosis not present

## 2020-09-08 DIAGNOSIS — N179 Acute kidney failure, unspecified: Secondary | ICD-10-CM | POA: Diagnosis not present

## 2020-09-08 DIAGNOSIS — Z96653 Presence of artificial knee joint, bilateral: Secondary | ICD-10-CM | POA: Diagnosis not present

## 2020-09-08 DIAGNOSIS — I110 Hypertensive heart disease with heart failure: Secondary | ICD-10-CM | POA: Diagnosis not present

## 2020-09-08 DIAGNOSIS — I4439 Other atrioventricular block: Secondary | ICD-10-CM | POA: Diagnosis not present

## 2020-09-08 DIAGNOSIS — I4902 Ventricular flutter: Secondary | ICD-10-CM | POA: Diagnosis not present

## 2020-09-08 DIAGNOSIS — I501 Left ventricular failure: Secondary | ICD-10-CM | POA: Diagnosis not present

## 2020-09-08 DIAGNOSIS — I493 Ventricular premature depolarization: Secondary | ICD-10-CM | POA: Diagnosis not present

## 2020-09-08 DIAGNOSIS — N19 Unspecified kidney failure: Secondary | ICD-10-CM | POA: Diagnosis not present

## 2020-09-08 DIAGNOSIS — R0789 Other chest pain: Secondary | ICD-10-CM | POA: Diagnosis not present

## 2020-09-08 DIAGNOSIS — I4430 Unspecified atrioventricular block: Secondary | ICD-10-CM | POA: Diagnosis not present

## 2020-09-08 DIAGNOSIS — I10 Essential (primary) hypertension: Secondary | ICD-10-CM | POA: Diagnosis not present

## 2020-09-08 DIAGNOSIS — N1330 Unspecified hydronephrosis: Secondary | ICD-10-CM | POA: Diagnosis not present

## 2020-09-08 DIAGNOSIS — Z8261 Family history of arthritis: Secondary | ICD-10-CM | POA: Diagnosis not present

## 2020-09-08 DIAGNOSIS — I5089 Other heart failure: Secondary | ICD-10-CM | POA: Diagnosis not present

## 2020-09-08 DIAGNOSIS — I482 Chronic atrial fibrillation, unspecified: Secondary | ICD-10-CM | POA: Diagnosis not present

## 2020-09-08 DIAGNOSIS — E1165 Type 2 diabetes mellitus with hyperglycemia: Secondary | ICD-10-CM | POA: Diagnosis not present

## 2020-09-08 DIAGNOSIS — Z833 Family history of diabetes mellitus: Secondary | ICD-10-CM | POA: Diagnosis not present

## 2020-09-08 DIAGNOSIS — R0600 Dyspnea, unspecified: Secondary | ICD-10-CM | POA: Diagnosis not present

## 2020-09-08 DIAGNOSIS — E119 Type 2 diabetes mellitus without complications: Secondary | ICD-10-CM | POA: Diagnosis not present

## 2020-09-08 DIAGNOSIS — D72828 Other elevated white blood cell count: Secondary | ICD-10-CM | POA: Diagnosis not present

## 2020-09-08 DIAGNOSIS — Z79899 Other long term (current) drug therapy: Secondary | ICD-10-CM | POA: Diagnosis not present

## 2020-09-08 DIAGNOSIS — I509 Heart failure, unspecified: Secondary | ICD-10-CM | POA: Diagnosis not present

## 2020-09-08 DIAGNOSIS — I4892 Unspecified atrial flutter: Secondary | ICD-10-CM | POA: Diagnosis not present

## 2020-09-08 DIAGNOSIS — R9389 Abnormal findings on diagnostic imaging of other specified body structures: Secondary | ICD-10-CM | POA: Diagnosis not present

## 2020-09-08 DIAGNOSIS — Z20822 Contact with and (suspected) exposure to covid-19: Secondary | ICD-10-CM | POA: Diagnosis not present

## 2020-09-08 DIAGNOSIS — Z96652 Presence of left artificial knee joint: Secondary | ICD-10-CM | POA: Diagnosis not present

## 2020-09-08 DIAGNOSIS — N132 Hydronephrosis with renal and ureteral calculous obstruction: Secondary | ICD-10-CM | POA: Diagnosis not present

## 2020-09-08 DIAGNOSIS — D72829 Elevated white blood cell count, unspecified: Secondary | ICD-10-CM | POA: Diagnosis not present

## 2020-09-08 DIAGNOSIS — I483 Typical atrial flutter: Secondary | ICD-10-CM | POA: Diagnosis not present

## 2020-09-08 DIAGNOSIS — R079 Chest pain, unspecified: Secondary | ICD-10-CM | POA: Diagnosis not present

## 2020-09-09 DIAGNOSIS — I4891 Unspecified atrial fibrillation: Secondary | ICD-10-CM | POA: Diagnosis not present

## 2020-09-09 DIAGNOSIS — R0602 Shortness of breath: Secondary | ICD-10-CM | POA: Diagnosis not present

## 2020-09-09 DIAGNOSIS — E119 Type 2 diabetes mellitus without complications: Secondary | ICD-10-CM | POA: Diagnosis not present

## 2020-09-09 DIAGNOSIS — N200 Calculus of kidney: Secondary | ICD-10-CM | POA: Diagnosis not present

## 2020-09-09 DIAGNOSIS — I4430 Unspecified atrioventricular block: Secondary | ICD-10-CM | POA: Diagnosis not present

## 2020-09-09 DIAGNOSIS — I4892 Unspecified atrial flutter: Secondary | ICD-10-CM | POA: Diagnosis not present

## 2020-09-09 DIAGNOSIS — I482 Chronic atrial fibrillation, unspecified: Secondary | ICD-10-CM | POA: Diagnosis not present

## 2020-09-09 DIAGNOSIS — I501 Left ventricular failure: Secondary | ICD-10-CM | POA: Diagnosis not present

## 2020-09-09 DIAGNOSIS — I493 Ventricular premature depolarization: Secondary | ICD-10-CM | POA: Diagnosis not present

## 2020-09-09 DIAGNOSIS — N179 Acute kidney failure, unspecified: Secondary | ICD-10-CM | POA: Diagnosis not present

## 2020-09-10 DIAGNOSIS — N200 Calculus of kidney: Secondary | ICD-10-CM | POA: Diagnosis not present

## 2020-09-10 DIAGNOSIS — I4892 Unspecified atrial flutter: Secondary | ICD-10-CM | POA: Diagnosis not present

## 2020-09-10 DIAGNOSIS — E119 Type 2 diabetes mellitus without complications: Secondary | ICD-10-CM | POA: Diagnosis not present

## 2020-09-10 DIAGNOSIS — R9389 Abnormal findings on diagnostic imaging of other specified body structures: Secondary | ICD-10-CM | POA: Diagnosis not present

## 2020-09-10 DIAGNOSIS — I4439 Other atrioventricular block: Secondary | ICD-10-CM | POA: Diagnosis not present

## 2020-09-10 DIAGNOSIS — I5089 Other heart failure: Secondary | ICD-10-CM | POA: Diagnosis not present

## 2020-09-10 DIAGNOSIS — I482 Chronic atrial fibrillation, unspecified: Secondary | ICD-10-CM | POA: Diagnosis not present

## 2020-09-10 DIAGNOSIS — N179 Acute kidney failure, unspecified: Secondary | ICD-10-CM | POA: Diagnosis not present

## 2020-09-10 DIAGNOSIS — I501 Left ventricular failure: Secondary | ICD-10-CM | POA: Diagnosis not present

## 2020-09-11 DIAGNOSIS — I482 Chronic atrial fibrillation, unspecified: Secondary | ICD-10-CM | POA: Diagnosis not present

## 2020-09-11 DIAGNOSIS — E119 Type 2 diabetes mellitus without complications: Secondary | ICD-10-CM | POA: Diagnosis not present

## 2020-09-11 DIAGNOSIS — N1330 Unspecified hydronephrosis: Secondary | ICD-10-CM | POA: Diagnosis not present

## 2020-09-11 DIAGNOSIS — I4892 Unspecified atrial flutter: Secondary | ICD-10-CM | POA: Diagnosis not present

## 2020-09-11 DIAGNOSIS — I509 Heart failure, unspecified: Secondary | ICD-10-CM | POA: Diagnosis not present

## 2020-09-11 DIAGNOSIS — N179 Acute kidney failure, unspecified: Secondary | ICD-10-CM | POA: Diagnosis not present

## 2020-09-11 DIAGNOSIS — N200 Calculus of kidney: Secondary | ICD-10-CM | POA: Diagnosis not present

## 2020-09-12 ENCOUNTER — Ambulatory Visit: Payer: PPO | Admitting: Cardiology

## 2020-09-12 DIAGNOSIS — N200 Calculus of kidney: Secondary | ICD-10-CM | POA: Diagnosis not present

## 2020-09-12 DIAGNOSIS — N179 Acute kidney failure, unspecified: Secondary | ICD-10-CM | POA: Diagnosis not present

## 2020-09-12 DIAGNOSIS — I4891 Unspecified atrial fibrillation: Secondary | ICD-10-CM | POA: Diagnosis not present

## 2020-09-12 DIAGNOSIS — I4892 Unspecified atrial flutter: Secondary | ICD-10-CM | POA: Diagnosis not present

## 2020-09-12 DIAGNOSIS — I501 Left ventricular failure: Secondary | ICD-10-CM | POA: Diagnosis not present

## 2020-09-16 DIAGNOSIS — M7989 Other specified soft tissue disorders: Secondary | ICD-10-CM | POA: Diagnosis not present

## 2020-09-16 DIAGNOSIS — N179 Acute kidney failure, unspecified: Secondary | ICD-10-CM | POA: Diagnosis not present

## 2020-09-16 DIAGNOSIS — R222 Localized swelling, mass and lump, trunk: Secondary | ICD-10-CM | POA: Diagnosis not present

## 2020-09-16 DIAGNOSIS — Z87442 Personal history of urinary calculi: Secondary | ICD-10-CM | POA: Diagnosis not present

## 2020-09-16 DIAGNOSIS — Z6829 Body mass index (BMI) 29.0-29.9, adult: Secondary | ICD-10-CM | POA: Diagnosis not present

## 2020-09-16 DIAGNOSIS — E11621 Type 2 diabetes mellitus with foot ulcer: Secondary | ICD-10-CM | POA: Diagnosis not present

## 2020-09-16 DIAGNOSIS — I4891 Unspecified atrial fibrillation: Secondary | ICD-10-CM | POA: Diagnosis not present

## 2020-09-17 DIAGNOSIS — N200 Calculus of kidney: Secondary | ICD-10-CM | POA: Diagnosis not present

## 2020-09-23 DIAGNOSIS — J90 Pleural effusion, not elsewhere classified: Secondary | ICD-10-CM | POA: Diagnosis not present

## 2020-09-23 DIAGNOSIS — J9859 Other diseases of mediastinum, not elsewhere classified: Secondary | ICD-10-CM | POA: Diagnosis not present

## 2020-09-23 DIAGNOSIS — R918 Other nonspecific abnormal finding of lung field: Secondary | ICD-10-CM | POA: Diagnosis not present

## 2020-09-25 DIAGNOSIS — R221 Localized swelling, mass and lump, neck: Secondary | ICD-10-CM | POA: Diagnosis not present

## 2020-09-25 DIAGNOSIS — I4891 Unspecified atrial fibrillation: Secondary | ICD-10-CM | POA: Diagnosis not present

## 2020-09-25 DIAGNOSIS — Z791 Long term (current) use of non-steroidal anti-inflammatories (NSAID): Secondary | ICD-10-CM | POA: Diagnosis not present

## 2020-09-25 DIAGNOSIS — Z6828 Body mass index (BMI) 28.0-28.9, adult: Secondary | ICD-10-CM | POA: Diagnosis not present

## 2020-09-25 DIAGNOSIS — R222 Localized swelling, mass and lump, trunk: Secondary | ICD-10-CM | POA: Diagnosis not present

## 2020-10-01 DIAGNOSIS — N202 Calculus of kidney with calculus of ureter: Secondary | ICD-10-CM | POA: Diagnosis not present

## 2020-10-01 DIAGNOSIS — T191XXA Foreign body in bladder, initial encounter: Secondary | ICD-10-CM | POA: Diagnosis not present

## 2020-10-02 ENCOUNTER — Other Ambulatory Visit: Payer: PPO

## 2020-10-07 DIAGNOSIS — K219 Gastro-esophageal reflux disease without esophagitis: Secondary | ICD-10-CM | POA: Diagnosis not present

## 2020-10-07 DIAGNOSIS — Z6828 Body mass index (BMI) 28.0-28.9, adult: Secondary | ICD-10-CM | POA: Diagnosis not present

## 2020-10-07 DIAGNOSIS — E785 Hyperlipidemia, unspecified: Secondary | ICD-10-CM | POA: Diagnosis not present

## 2020-10-07 DIAGNOSIS — I4891 Unspecified atrial fibrillation: Secondary | ICD-10-CM | POA: Diagnosis not present

## 2020-10-07 DIAGNOSIS — E1169 Type 2 diabetes mellitus with other specified complication: Secondary | ICD-10-CM | POA: Diagnosis not present

## 2020-10-08 DIAGNOSIS — E11621 Type 2 diabetes mellitus with foot ulcer: Secondary | ICD-10-CM | POA: Diagnosis not present

## 2020-10-08 DIAGNOSIS — M14679 Charcot's joint, unspecified ankle and foot: Secondary | ICD-10-CM | POA: Diagnosis not present

## 2020-10-08 DIAGNOSIS — E1140 Type 2 diabetes mellitus with diabetic neuropathy, unspecified: Secondary | ICD-10-CM | POA: Diagnosis not present

## 2020-10-09 DIAGNOSIS — E11621 Type 2 diabetes mellitus with foot ulcer: Secondary | ICD-10-CM | POA: Diagnosis not present

## 2020-10-10 DIAGNOSIS — E7849 Other hyperlipidemia: Secondary | ICD-10-CM | POA: Diagnosis not present

## 2020-10-10 DIAGNOSIS — E1169 Type 2 diabetes mellitus with other specified complication: Secondary | ICD-10-CM | POA: Diagnosis not present

## 2020-10-10 DIAGNOSIS — K219 Gastro-esophageal reflux disease without esophagitis: Secondary | ICD-10-CM | POA: Diagnosis not present

## 2020-10-15 DIAGNOSIS — N200 Calculus of kidney: Secondary | ICD-10-CM | POA: Diagnosis not present

## 2020-10-21 DIAGNOSIS — E11621 Type 2 diabetes mellitus with foot ulcer: Secondary | ICD-10-CM | POA: Diagnosis not present

## 2020-10-21 DIAGNOSIS — E785 Hyperlipidemia, unspecified: Secondary | ICD-10-CM | POA: Diagnosis not present

## 2020-10-21 DIAGNOSIS — Z6827 Body mass index (BMI) 27.0-27.9, adult: Secondary | ICD-10-CM | POA: Diagnosis not present

## 2020-10-21 DIAGNOSIS — E1169 Type 2 diabetes mellitus with other specified complication: Secondary | ICD-10-CM | POA: Diagnosis not present

## 2020-10-21 DIAGNOSIS — E11610 Type 2 diabetes mellitus with diabetic neuropathic arthropathy: Secondary | ICD-10-CM | POA: Diagnosis not present

## 2020-10-21 DIAGNOSIS — L97519 Non-pressure chronic ulcer of other part of right foot with unspecified severity: Secondary | ICD-10-CM | POA: Diagnosis not present

## 2020-10-21 DIAGNOSIS — I4891 Unspecified atrial fibrillation: Secondary | ICD-10-CM | POA: Diagnosis not present

## 2020-10-31 DIAGNOSIS — E11621 Type 2 diabetes mellitus with foot ulcer: Secondary | ICD-10-CM | POA: Diagnosis not present

## 2020-11-06 DIAGNOSIS — L97509 Non-pressure chronic ulcer of other part of unspecified foot with unspecified severity: Secondary | ICD-10-CM | POA: Diagnosis not present

## 2020-11-06 DIAGNOSIS — I480 Paroxysmal atrial fibrillation: Secondary | ICD-10-CM | POA: Diagnosis not present

## 2020-11-06 DIAGNOSIS — E11621 Type 2 diabetes mellitus with foot ulcer: Secondary | ICD-10-CM | POA: Diagnosis not present

## 2020-11-06 DIAGNOSIS — Z96652 Presence of left artificial knee joint: Secondary | ICD-10-CM | POA: Diagnosis not present

## 2020-11-06 DIAGNOSIS — N200 Calculus of kidney: Secondary | ICD-10-CM | POA: Diagnosis not present

## 2020-11-06 DIAGNOSIS — I4891 Unspecified atrial fibrillation: Secondary | ICD-10-CM | POA: Diagnosis not present

## 2020-11-18 DIAGNOSIS — I480 Paroxysmal atrial fibrillation: Secondary | ICD-10-CM | POA: Diagnosis not present

## 2020-11-18 DIAGNOSIS — Z01812 Encounter for preprocedural laboratory examination: Secondary | ICD-10-CM | POA: Diagnosis not present

## 2020-11-19 DIAGNOSIS — I480 Paroxysmal atrial fibrillation: Secondary | ICD-10-CM | POA: Diagnosis not present

## 2020-11-20 DIAGNOSIS — E11621 Type 2 diabetes mellitus with foot ulcer: Secondary | ICD-10-CM | POA: Diagnosis not present

## 2020-11-20 DIAGNOSIS — I4891 Unspecified atrial fibrillation: Secondary | ICD-10-CM | POA: Diagnosis not present

## 2020-11-20 DIAGNOSIS — Z6826 Body mass index (BMI) 26.0-26.9, adult: Secondary | ICD-10-CM | POA: Diagnosis not present

## 2020-11-20 DIAGNOSIS — L97519 Non-pressure chronic ulcer of other part of right foot with unspecified severity: Secondary | ICD-10-CM | POA: Diagnosis not present

## 2020-11-22 DIAGNOSIS — I119 Hypertensive heart disease without heart failure: Secondary | ICD-10-CM | POA: Diagnosis not present

## 2020-11-22 DIAGNOSIS — Z79899 Other long term (current) drug therapy: Secondary | ICD-10-CM | POA: Diagnosis not present

## 2020-11-22 DIAGNOSIS — I4892 Unspecified atrial flutter: Secondary | ICD-10-CM | POA: Diagnosis not present

## 2020-11-22 DIAGNOSIS — R059 Cough, unspecified: Secondary | ICD-10-CM | POA: Diagnosis not present

## 2020-11-22 DIAGNOSIS — E785 Hyperlipidemia, unspecified: Secondary | ICD-10-CM | POA: Diagnosis not present

## 2020-11-22 DIAGNOSIS — N200 Calculus of kidney: Secondary | ICD-10-CM | POA: Diagnosis not present

## 2020-11-22 DIAGNOSIS — I429 Cardiomyopathy, unspecified: Secondary | ICD-10-CM | POA: Diagnosis not present

## 2020-11-22 DIAGNOSIS — L97519 Non-pressure chronic ulcer of other part of right foot with unspecified severity: Secondary | ICD-10-CM | POA: Diagnosis not present

## 2020-11-22 DIAGNOSIS — Z7901 Long term (current) use of anticoagulants: Secondary | ICD-10-CM | POA: Diagnosis not present

## 2020-11-22 DIAGNOSIS — E11621 Type 2 diabetes mellitus with foot ulcer: Secondary | ICD-10-CM | POA: Diagnosis not present

## 2020-11-22 DIAGNOSIS — I4891 Unspecified atrial fibrillation: Secondary | ICD-10-CM | POA: Diagnosis not present

## 2020-11-22 DIAGNOSIS — Z7984 Long term (current) use of oral hypoglycemic drugs: Secondary | ICD-10-CM | POA: Diagnosis not present

## 2020-11-22 DIAGNOSIS — Z96652 Presence of left artificial knee joint: Secondary | ICD-10-CM | POA: Diagnosis not present

## 2020-11-23 DIAGNOSIS — I491 Atrial premature depolarization: Secondary | ICD-10-CM | POA: Diagnosis not present

## 2020-11-23 DIAGNOSIS — I4892 Unspecified atrial flutter: Secondary | ICD-10-CM | POA: Diagnosis not present

## 2020-11-23 DIAGNOSIS — Z9889 Other specified postprocedural states: Secondary | ICD-10-CM | POA: Diagnosis not present

## 2020-12-05 DIAGNOSIS — N200 Calculus of kidney: Secondary | ICD-10-CM | POA: Diagnosis not present

## 2020-12-06 DIAGNOSIS — E1169 Type 2 diabetes mellitus with other specified complication: Secondary | ICD-10-CM | POA: Diagnosis not present

## 2020-12-06 DIAGNOSIS — E785 Hyperlipidemia, unspecified: Secondary | ICD-10-CM | POA: Diagnosis not present

## 2020-12-09 DIAGNOSIS — E7849 Other hyperlipidemia: Secondary | ICD-10-CM | POA: Diagnosis not present

## 2020-12-09 DIAGNOSIS — K219 Gastro-esophageal reflux disease without esophagitis: Secondary | ICD-10-CM | POA: Diagnosis not present

## 2020-12-09 DIAGNOSIS — E1169 Type 2 diabetes mellitus with other specified complication: Secondary | ICD-10-CM | POA: Diagnosis not present

## 2020-12-12 DIAGNOSIS — E11621 Type 2 diabetes mellitus with foot ulcer: Secondary | ICD-10-CM | POA: Diagnosis not present

## 2020-12-20 DIAGNOSIS — L97509 Non-pressure chronic ulcer of other part of unspecified foot with unspecified severity: Secondary | ICD-10-CM | POA: Diagnosis not present

## 2020-12-20 DIAGNOSIS — E785 Hyperlipidemia, unspecified: Secondary | ICD-10-CM | POA: Diagnosis not present

## 2020-12-20 DIAGNOSIS — I4892 Unspecified atrial flutter: Secondary | ICD-10-CM | POA: Diagnosis not present

## 2020-12-20 DIAGNOSIS — E11621 Type 2 diabetes mellitus with foot ulcer: Secondary | ICD-10-CM | POA: Diagnosis not present

## 2020-12-20 DIAGNOSIS — I10 Essential (primary) hypertension: Secondary | ICD-10-CM | POA: Diagnosis not present

## 2020-12-20 DIAGNOSIS — I43 Cardiomyopathy in diseases classified elsewhere: Secondary | ICD-10-CM | POA: Diagnosis not present

## 2020-12-20 DIAGNOSIS — R000 Tachycardia, unspecified: Secondary | ICD-10-CM | POA: Diagnosis not present

## 2020-12-23 DIAGNOSIS — R221 Localized swelling, mass and lump, neck: Secondary | ICD-10-CM | POA: Diagnosis not present

## 2020-12-25 DIAGNOSIS — E213 Hyperparathyroidism, unspecified: Secondary | ICD-10-CM | POA: Diagnosis not present

## 2020-12-25 DIAGNOSIS — R221 Localized swelling, mass and lump, neck: Secondary | ICD-10-CM | POA: Diagnosis not present

## 2021-01-02 DIAGNOSIS — J329 Chronic sinusitis, unspecified: Secondary | ICD-10-CM | POA: Diagnosis not present

## 2021-01-02 DIAGNOSIS — J40 Bronchitis, not specified as acute or chronic: Secondary | ICD-10-CM | POA: Diagnosis not present

## 2021-01-02 DIAGNOSIS — Z6826 Body mass index (BMI) 26.0-26.9, adult: Secondary | ICD-10-CM | POA: Diagnosis not present

## 2021-01-07 DIAGNOSIS — I4892 Unspecified atrial flutter: Secondary | ICD-10-CM | POA: Diagnosis not present

## 2021-01-07 DIAGNOSIS — I43 Cardiomyopathy in diseases classified elsewhere: Secondary | ICD-10-CM | POA: Diagnosis not present

## 2021-01-07 DIAGNOSIS — R000 Tachycardia, unspecified: Secondary | ICD-10-CM | POA: Diagnosis not present

## 2021-01-27 DIAGNOSIS — E11621 Type 2 diabetes mellitus with foot ulcer: Secondary | ICD-10-CM | POA: Diagnosis not present

## 2021-02-08 DIAGNOSIS — K219 Gastro-esophageal reflux disease without esophagitis: Secondary | ICD-10-CM | POA: Diagnosis not present

## 2021-02-08 DIAGNOSIS — E7849 Other hyperlipidemia: Secondary | ICD-10-CM | POA: Diagnosis not present

## 2021-02-08 DIAGNOSIS — E1169 Type 2 diabetes mellitus with other specified complication: Secondary | ICD-10-CM | POA: Diagnosis not present

## 2021-03-05 DIAGNOSIS — R000 Tachycardia, unspecified: Secondary | ICD-10-CM | POA: Diagnosis not present

## 2021-03-05 DIAGNOSIS — I43 Cardiomyopathy in diseases classified elsewhere: Secondary | ICD-10-CM | POA: Diagnosis not present

## 2021-03-05 DIAGNOSIS — Z9889 Other specified postprocedural states: Secondary | ICD-10-CM | POA: Diagnosis not present

## 2021-03-05 DIAGNOSIS — E785 Hyperlipidemia, unspecified: Secondary | ICD-10-CM | POA: Diagnosis not present

## 2021-03-05 DIAGNOSIS — E1169 Type 2 diabetes mellitus with other specified complication: Secondary | ICD-10-CM | POA: Diagnosis not present

## 2021-03-05 DIAGNOSIS — N200 Calculus of kidney: Secondary | ICD-10-CM | POA: Diagnosis not present

## 2021-03-05 DIAGNOSIS — I10 Essential (primary) hypertension: Secondary | ICD-10-CM | POA: Diagnosis not present

## 2021-03-05 DIAGNOSIS — L97509 Non-pressure chronic ulcer of other part of unspecified foot with unspecified severity: Secondary | ICD-10-CM | POA: Diagnosis not present

## 2021-03-05 DIAGNOSIS — I4892 Unspecified atrial flutter: Secondary | ICD-10-CM | POA: Diagnosis not present

## 2021-03-05 DIAGNOSIS — E11621 Type 2 diabetes mellitus with foot ulcer: Secondary | ICD-10-CM | POA: Diagnosis not present

## 2021-03-06 DIAGNOSIS — I499 Cardiac arrhythmia, unspecified: Secondary | ICD-10-CM | POA: Diagnosis not present

## 2021-03-10 DIAGNOSIS — E1169 Type 2 diabetes mellitus with other specified complication: Secondary | ICD-10-CM | POA: Diagnosis not present

## 2021-03-10 DIAGNOSIS — E7849 Other hyperlipidemia: Secondary | ICD-10-CM | POA: Diagnosis not present

## 2021-03-10 DIAGNOSIS — K219 Gastro-esophageal reflux disease without esophagitis: Secondary | ICD-10-CM | POA: Diagnosis not present

## 2021-03-21 DIAGNOSIS — N138 Other obstructive and reflux uropathy: Secondary | ICD-10-CM | POA: Diagnosis not present

## 2021-03-21 DIAGNOSIS — N200 Calculus of kidney: Secondary | ICD-10-CM | POA: Diagnosis not present

## 2021-03-21 DIAGNOSIS — N401 Enlarged prostate with lower urinary tract symptoms: Secondary | ICD-10-CM | POA: Diagnosis not present

## 2021-03-25 DIAGNOSIS — Z1331 Encounter for screening for depression: Secondary | ICD-10-CM | POA: Diagnosis not present

## 2021-03-25 DIAGNOSIS — M7632 Iliotibial band syndrome, left leg: Secondary | ICD-10-CM | POA: Diagnosis not present

## 2021-03-25 DIAGNOSIS — J309 Allergic rhinitis, unspecified: Secondary | ICD-10-CM | POA: Diagnosis not present

## 2021-03-25 DIAGNOSIS — Z6827 Body mass index (BMI) 27.0-27.9, adult: Secondary | ICD-10-CM | POA: Diagnosis not present

## 2021-03-25 DIAGNOSIS — R0982 Postnasal drip: Secondary | ICD-10-CM | POA: Diagnosis not present

## 2021-03-27 DIAGNOSIS — I10 Essential (primary) hypertension: Secondary | ICD-10-CM | POA: Diagnosis not present

## 2021-03-27 DIAGNOSIS — R000 Tachycardia, unspecified: Secondary | ICD-10-CM | POA: Diagnosis not present

## 2021-03-27 DIAGNOSIS — I43 Cardiomyopathy in diseases classified elsewhere: Secondary | ICD-10-CM | POA: Diagnosis not present

## 2021-03-27 DIAGNOSIS — I4892 Unspecified atrial flutter: Secondary | ICD-10-CM | POA: Diagnosis not present

## 2021-04-06 DIAGNOSIS — I493 Ventricular premature depolarization: Secondary | ICD-10-CM | POA: Diagnosis not present

## 2021-05-09 DIAGNOSIS — E1169 Type 2 diabetes mellitus with other specified complication: Secondary | ICD-10-CM | POA: Diagnosis not present

## 2021-05-09 DIAGNOSIS — M545 Low back pain, unspecified: Secondary | ICD-10-CM | POA: Diagnosis not present

## 2021-05-09 DIAGNOSIS — E785 Hyperlipidemia, unspecified: Secondary | ICD-10-CM | POA: Diagnosis not present

## 2021-05-09 DIAGNOSIS — Z6827 Body mass index (BMI) 27.0-27.9, adult: Secondary | ICD-10-CM | POA: Diagnosis not present

## 2021-05-11 DIAGNOSIS — E7849 Other hyperlipidemia: Secondary | ICD-10-CM | POA: Diagnosis not present

## 2021-05-11 DIAGNOSIS — K219 Gastro-esophageal reflux disease without esophagitis: Secondary | ICD-10-CM | POA: Diagnosis not present

## 2021-05-11 DIAGNOSIS — E1169 Type 2 diabetes mellitus with other specified complication: Secondary | ICD-10-CM | POA: Diagnosis not present

## 2021-06-05 DIAGNOSIS — M545 Low back pain, unspecified: Secondary | ICD-10-CM | POA: Diagnosis not present

## 2021-06-05 DIAGNOSIS — Z6827 Body mass index (BMI) 27.0-27.9, adult: Secondary | ICD-10-CM | POA: Diagnosis not present

## 2021-06-18 DIAGNOSIS — M25652 Stiffness of left hip, not elsewhere classified: Secondary | ICD-10-CM | POA: Diagnosis not present

## 2021-06-18 DIAGNOSIS — M2560 Stiffness of unspecified joint, not elsewhere classified: Secondary | ICD-10-CM | POA: Diagnosis not present

## 2021-06-18 DIAGNOSIS — M25552 Pain in left hip: Secondary | ICD-10-CM | POA: Diagnosis not present

## 2021-07-17 DIAGNOSIS — N200 Calculus of kidney: Secondary | ICD-10-CM | POA: Diagnosis not present

## 2021-07-27 DIAGNOSIS — S8991XA Unspecified injury of right lower leg, initial encounter: Secondary | ICD-10-CM | POA: Diagnosis not present

## 2021-07-27 DIAGNOSIS — M25861 Other specified joint disorders, right knee: Secondary | ICD-10-CM | POA: Diagnosis not present

## 2021-07-27 DIAGNOSIS — M25461 Effusion, right knee: Secondary | ICD-10-CM | POA: Diagnosis not present

## 2021-07-27 DIAGNOSIS — M25561 Pain in right knee: Secondary | ICD-10-CM | POA: Diagnosis not present

## 2021-07-27 DIAGNOSIS — S82091A Other fracture of right patella, initial encounter for closed fracture: Secondary | ICD-10-CM | POA: Diagnosis not present

## 2021-07-27 DIAGNOSIS — M1711 Unilateral primary osteoarthritis, right knee: Secondary | ICD-10-CM | POA: Diagnosis not present

## 2021-07-29 DIAGNOSIS — Z23 Encounter for immunization: Secondary | ICD-10-CM | POA: Diagnosis not present

## 2021-07-29 DIAGNOSIS — S82091A Other fracture of right patella, initial encounter for closed fracture: Secondary | ICD-10-CM | POA: Diagnosis not present

## 2021-08-11 DIAGNOSIS — S83231A Complex tear of medial meniscus, current injury, right knee, initial encounter: Secondary | ICD-10-CM | POA: Diagnosis not present

## 2021-08-11 DIAGNOSIS — M25461 Effusion, right knee: Secondary | ICD-10-CM | POA: Diagnosis not present

## 2021-08-11 DIAGNOSIS — M25561 Pain in right knee: Secondary | ICD-10-CM | POA: Diagnosis not present

## 2021-08-11 DIAGNOSIS — S83281A Other tear of lateral meniscus, current injury, right knee, initial encounter: Secondary | ICD-10-CM | POA: Diagnosis not present

## 2021-08-11 DIAGNOSIS — G8929 Other chronic pain: Secondary | ICD-10-CM | POA: Diagnosis not present

## 2021-08-14 DIAGNOSIS — S83206A Unspecified tear of unspecified meniscus, current injury, right knee, initial encounter: Secondary | ICD-10-CM | POA: Diagnosis not present

## 2021-08-14 DIAGNOSIS — M1711 Unilateral primary osteoarthritis, right knee: Secondary | ICD-10-CM | POA: Diagnosis not present

## 2021-08-27 DIAGNOSIS — E1169 Type 2 diabetes mellitus with other specified complication: Secondary | ICD-10-CM | POA: Diagnosis not present

## 2021-08-27 DIAGNOSIS — Z01818 Encounter for other preprocedural examination: Secondary | ICD-10-CM | POA: Diagnosis not present

## 2021-08-27 DIAGNOSIS — Z6827 Body mass index (BMI) 27.0-27.9, adult: Secondary | ICD-10-CM | POA: Diagnosis not present

## 2021-08-27 DIAGNOSIS — E11610 Type 2 diabetes mellitus with diabetic neuropathic arthropathy: Secondary | ICD-10-CM | POA: Diagnosis not present

## 2021-08-27 DIAGNOSIS — E785 Hyperlipidemia, unspecified: Secondary | ICD-10-CM | POA: Diagnosis not present

## 2021-09-10 DIAGNOSIS — E785 Hyperlipidemia, unspecified: Secondary | ICD-10-CM | POA: Diagnosis not present

## 2021-09-10 DIAGNOSIS — L97509 Non-pressure chronic ulcer of other part of unspecified foot with unspecified severity: Secondary | ICD-10-CM | POA: Diagnosis not present

## 2021-09-10 DIAGNOSIS — I10 Essential (primary) hypertension: Secondary | ICD-10-CM | POA: Diagnosis not present

## 2021-09-10 DIAGNOSIS — I4892 Unspecified atrial flutter: Secondary | ICD-10-CM | POA: Diagnosis not present

## 2021-09-10 DIAGNOSIS — I43 Cardiomyopathy in diseases classified elsewhere: Secondary | ICD-10-CM | POA: Diagnosis not present

## 2021-09-10 DIAGNOSIS — E11621 Type 2 diabetes mellitus with foot ulcer: Secondary | ICD-10-CM | POA: Diagnosis not present

## 2021-09-10 DIAGNOSIS — R000 Tachycardia, unspecified: Secondary | ICD-10-CM | POA: Diagnosis not present

## 2021-10-15 DIAGNOSIS — E785 Hyperlipidemia, unspecified: Secondary | ICD-10-CM | POA: Diagnosis not present

## 2021-10-15 DIAGNOSIS — E1169 Type 2 diabetes mellitus with other specified complication: Secondary | ICD-10-CM | POA: Diagnosis not present

## 2021-10-15 DIAGNOSIS — E11610 Type 2 diabetes mellitus with diabetic neuropathic arthropathy: Secondary | ICD-10-CM | POA: Diagnosis not present

## 2021-10-15 DIAGNOSIS — E1140 Type 2 diabetes mellitus with diabetic neuropathy, unspecified: Secondary | ICD-10-CM | POA: Diagnosis not present

## 2021-10-15 DIAGNOSIS — Z0000 Encounter for general adult medical examination without abnormal findings: Secondary | ICD-10-CM | POA: Diagnosis not present

## 2021-10-15 DIAGNOSIS — Z6827 Body mass index (BMI) 27.0-27.9, adult: Secondary | ICD-10-CM | POA: Diagnosis not present

## 2021-10-15 DIAGNOSIS — I4891 Unspecified atrial fibrillation: Secondary | ICD-10-CM | POA: Diagnosis not present

## 2021-11-04 DIAGNOSIS — Z419 Encounter for procedure for purposes other than remedying health state, unspecified: Secondary | ICD-10-CM | POA: Diagnosis not present

## 2021-11-05 DIAGNOSIS — H5213 Myopia, bilateral: Secondary | ICD-10-CM | POA: Diagnosis not present

## 2021-11-05 DIAGNOSIS — H524 Presbyopia: Secondary | ICD-10-CM | POA: Diagnosis not present

## 2021-11-05 DIAGNOSIS — H3561 Retinal hemorrhage, right eye: Secondary | ICD-10-CM | POA: Diagnosis not present

## 2021-11-05 DIAGNOSIS — H02839 Dermatochalasis of unspecified eye, unspecified eyelid: Secondary | ICD-10-CM | POA: Diagnosis not present

## 2021-11-05 DIAGNOSIS — H52223 Regular astigmatism, bilateral: Secondary | ICD-10-CM | POA: Diagnosis not present

## 2021-11-05 DIAGNOSIS — Z7984 Long term (current) use of oral hypoglycemic drugs: Secondary | ICD-10-CM | POA: Diagnosis not present

## 2021-11-05 DIAGNOSIS — H2513 Age-related nuclear cataract, bilateral: Secondary | ICD-10-CM | POA: Diagnosis not present

## 2021-11-11 DIAGNOSIS — E782 Mixed hyperlipidemia: Secondary | ICD-10-CM | POA: Diagnosis not present

## 2021-11-11 DIAGNOSIS — I10 Essential (primary) hypertension: Secondary | ICD-10-CM | POA: Diagnosis not present

## 2021-12-01 DIAGNOSIS — Z96651 Presence of right artificial knee joint: Secondary | ICD-10-CM | POA: Diagnosis not present

## 2021-12-01 DIAGNOSIS — G8918 Other acute postprocedural pain: Secondary | ICD-10-CM | POA: Diagnosis not present

## 2021-12-01 DIAGNOSIS — M1711 Unilateral primary osteoarthritis, right knee: Secondary | ICD-10-CM | POA: Diagnosis not present

## 2021-12-09 DIAGNOSIS — I10 Essential (primary) hypertension: Secondary | ICD-10-CM | POA: Diagnosis not present

## 2021-12-09 DIAGNOSIS — E1169 Type 2 diabetes mellitus with other specified complication: Secondary | ICD-10-CM | POA: Diagnosis not present

## 2021-12-09 DIAGNOSIS — E782 Mixed hyperlipidemia: Secondary | ICD-10-CM | POA: Diagnosis not present

## 2021-12-11 DIAGNOSIS — Z96651 Presence of right artificial knee joint: Secondary | ICD-10-CM | POA: Diagnosis not present

## 2021-12-12 DIAGNOSIS — M25661 Stiffness of right knee, not elsewhere classified: Secondary | ICD-10-CM | POA: Diagnosis not present

## 2021-12-12 DIAGNOSIS — R2689 Other abnormalities of gait and mobility: Secondary | ICD-10-CM | POA: Diagnosis not present

## 2021-12-12 DIAGNOSIS — Z96651 Presence of right artificial knee joint: Secondary | ICD-10-CM | POA: Diagnosis not present

## 2021-12-12 DIAGNOSIS — M25561 Pain in right knee: Secondary | ICD-10-CM | POA: Diagnosis not present

## 2021-12-15 DIAGNOSIS — M25561 Pain in right knee: Secondary | ICD-10-CM | POA: Diagnosis not present

## 2021-12-15 DIAGNOSIS — M25661 Stiffness of right knee, not elsewhere classified: Secondary | ICD-10-CM | POA: Diagnosis not present

## 2021-12-15 DIAGNOSIS — Z96651 Presence of right artificial knee joint: Secondary | ICD-10-CM | POA: Diagnosis not present

## 2021-12-15 DIAGNOSIS — R2689 Other abnormalities of gait and mobility: Secondary | ICD-10-CM | POA: Diagnosis not present

## 2021-12-18 DIAGNOSIS — M25661 Stiffness of right knee, not elsewhere classified: Secondary | ICD-10-CM | POA: Diagnosis not present

## 2021-12-18 DIAGNOSIS — Z96651 Presence of right artificial knee joint: Secondary | ICD-10-CM | POA: Diagnosis not present

## 2021-12-18 DIAGNOSIS — M25561 Pain in right knee: Secondary | ICD-10-CM | POA: Diagnosis not present

## 2021-12-18 DIAGNOSIS — R2689 Other abnormalities of gait and mobility: Secondary | ICD-10-CM | POA: Diagnosis not present

## 2021-12-22 DIAGNOSIS — A5216 Charcot's arthropathy (tabetic): Secondary | ICD-10-CM | POA: Diagnosis not present

## 2021-12-22 DIAGNOSIS — E1142 Type 2 diabetes mellitus with diabetic polyneuropathy: Secondary | ICD-10-CM | POA: Diagnosis not present

## 2021-12-23 DIAGNOSIS — Z96651 Presence of right artificial knee joint: Secondary | ICD-10-CM | POA: Diagnosis not present

## 2021-12-23 DIAGNOSIS — R2689 Other abnormalities of gait and mobility: Secondary | ICD-10-CM | POA: Diagnosis not present

## 2021-12-23 DIAGNOSIS — M25561 Pain in right knee: Secondary | ICD-10-CM | POA: Diagnosis not present

## 2021-12-23 DIAGNOSIS — M25661 Stiffness of right knee, not elsewhere classified: Secondary | ICD-10-CM | POA: Diagnosis not present

## 2021-12-25 DIAGNOSIS — M25661 Stiffness of right knee, not elsewhere classified: Secondary | ICD-10-CM | POA: Diagnosis not present

## 2021-12-25 DIAGNOSIS — Z96651 Presence of right artificial knee joint: Secondary | ICD-10-CM | POA: Diagnosis not present

## 2021-12-25 DIAGNOSIS — M25561 Pain in right knee: Secondary | ICD-10-CM | POA: Diagnosis not present

## 2021-12-25 DIAGNOSIS — R2689 Other abnormalities of gait and mobility: Secondary | ICD-10-CM | POA: Diagnosis not present

## 2021-12-29 DIAGNOSIS — Z96651 Presence of right artificial knee joint: Secondary | ICD-10-CM | POA: Diagnosis not present

## 2021-12-29 DIAGNOSIS — M25561 Pain in right knee: Secondary | ICD-10-CM | POA: Diagnosis not present

## 2021-12-29 DIAGNOSIS — M25661 Stiffness of right knee, not elsewhere classified: Secondary | ICD-10-CM | POA: Diagnosis not present

## 2021-12-29 DIAGNOSIS — R2689 Other abnormalities of gait and mobility: Secondary | ICD-10-CM | POA: Diagnosis not present

## 2022-01-09 DIAGNOSIS — I43 Cardiomyopathy in diseases classified elsewhere: Secondary | ICD-10-CM | POA: Diagnosis not present

## 2022-01-09 DIAGNOSIS — R000 Tachycardia, unspecified: Secondary | ICD-10-CM | POA: Diagnosis not present

## 2022-01-09 DIAGNOSIS — E785 Hyperlipidemia, unspecified: Secondary | ICD-10-CM | POA: Diagnosis not present

## 2022-01-09 DIAGNOSIS — I10 Essential (primary) hypertension: Secondary | ICD-10-CM | POA: Diagnosis not present

## 2022-01-09 DIAGNOSIS — I4892 Unspecified atrial flutter: Secondary | ICD-10-CM | POA: Diagnosis not present

## 2022-01-19 DIAGNOSIS — Z1211 Encounter for screening for malignant neoplasm of colon: Secondary | ICD-10-CM | POA: Diagnosis not present

## 2022-01-19 DIAGNOSIS — K219 Gastro-esophageal reflux disease without esophagitis: Secondary | ICD-10-CM | POA: Diagnosis not present

## 2022-01-19 DIAGNOSIS — R140 Abdominal distension (gaseous): Secondary | ICD-10-CM | POA: Diagnosis not present

## 2022-01-27 DIAGNOSIS — I351 Nonrheumatic aortic (valve) insufficiency: Secondary | ICD-10-CM | POA: Diagnosis not present

## 2022-02-18 DIAGNOSIS — E119 Type 2 diabetes mellitus without complications: Secondary | ICD-10-CM | POA: Diagnosis not present

## 2022-02-18 DIAGNOSIS — K635 Polyp of colon: Secondary | ICD-10-CM | POA: Diagnosis not present

## 2022-02-18 DIAGNOSIS — Z1211 Encounter for screening for malignant neoplasm of colon: Secondary | ICD-10-CM | POA: Diagnosis not present

## 2022-02-23 DIAGNOSIS — R3589 Other polyuria: Secondary | ICD-10-CM | POA: Diagnosis not present

## 2022-02-23 DIAGNOSIS — N138 Other obstructive and reflux uropathy: Secondary | ICD-10-CM | POA: Diagnosis not present

## 2022-02-23 DIAGNOSIS — N200 Calculus of kidney: Secondary | ICD-10-CM | POA: Diagnosis not present

## 2022-02-23 DIAGNOSIS — N401 Enlarged prostate with lower urinary tract symptoms: Secondary | ICD-10-CM | POA: Diagnosis not present

## 2022-02-25 DIAGNOSIS — Z1211 Encounter for screening for malignant neoplasm of colon: Secondary | ICD-10-CM | POA: Diagnosis not present

## 2022-02-25 DIAGNOSIS — E119 Type 2 diabetes mellitus without complications: Secondary | ICD-10-CM | POA: Diagnosis not present

## 2022-02-25 DIAGNOSIS — K219 Gastro-esophageal reflux disease without esophagitis: Secondary | ICD-10-CM | POA: Diagnosis not present

## 2022-02-27 DIAGNOSIS — K635 Polyp of colon: Secondary | ICD-10-CM | POA: Diagnosis not present

## 2022-03-07 DIAGNOSIS — K2270 Barrett's esophagus without dysplasia: Secondary | ICD-10-CM | POA: Diagnosis not present

## 2022-03-11 DIAGNOSIS — I10 Essential (primary) hypertension: Secondary | ICD-10-CM | POA: Diagnosis not present

## 2022-03-11 DIAGNOSIS — E119 Type 2 diabetes mellitus without complications: Secondary | ICD-10-CM | POA: Diagnosis not present

## 2022-03-11 DIAGNOSIS — Z9889 Other specified postprocedural states: Secondary | ICD-10-CM | POA: Diagnosis not present

## 2022-03-11 DIAGNOSIS — I4892 Unspecified atrial flutter: Secondary | ICD-10-CM | POA: Diagnosis not present

## 2022-03-11 DIAGNOSIS — R002 Palpitations: Secondary | ICD-10-CM | POA: Diagnosis not present

## 2022-03-11 DIAGNOSIS — Z79899 Other long term (current) drug therapy: Secondary | ICD-10-CM | POA: Diagnosis not present

## 2022-03-20 DIAGNOSIS — M14671 Charcot's joint, right ankle and foot: Secondary | ICD-10-CM | POA: Diagnosis not present

## 2022-03-20 DIAGNOSIS — M25571 Pain in right ankle and joints of right foot: Secondary | ICD-10-CM | POA: Diagnosis not present

## 2022-03-20 DIAGNOSIS — M79671 Pain in right foot: Secondary | ICD-10-CM | POA: Diagnosis not present

## 2022-03-20 DIAGNOSIS — M19071 Primary osteoarthritis, right ankle and foot: Secondary | ICD-10-CM | POA: Diagnosis not present

## 2022-03-30 DIAGNOSIS — R140 Abdominal distension (gaseous): Secondary | ICD-10-CM | POA: Diagnosis not present

## 2022-03-30 DIAGNOSIS — D369 Benign neoplasm, unspecified site: Secondary | ICD-10-CM | POA: Diagnosis not present

## 2022-03-30 DIAGNOSIS — K429 Umbilical hernia without obstruction or gangrene: Secondary | ICD-10-CM | POA: Diagnosis not present

## 2022-04-27 DIAGNOSIS — E1169 Type 2 diabetes mellitus with other specified complication: Secondary | ICD-10-CM | POA: Diagnosis not present

## 2022-04-27 DIAGNOSIS — E785 Hyperlipidemia, unspecified: Secondary | ICD-10-CM | POA: Diagnosis not present

## 2022-04-28 DIAGNOSIS — Z1331 Encounter for screening for depression: Secondary | ICD-10-CM | POA: Diagnosis not present

## 2022-04-28 DIAGNOSIS — Z9181 History of falling: Secondary | ICD-10-CM | POA: Diagnosis not present

## 2022-04-28 DIAGNOSIS — E11610 Type 2 diabetes mellitus with diabetic neuropathic arthropathy: Secondary | ICD-10-CM | POA: Diagnosis not present

## 2022-04-28 DIAGNOSIS — I4891 Unspecified atrial fibrillation: Secondary | ICD-10-CM | POA: Diagnosis not present

## 2022-04-28 DIAGNOSIS — E785 Hyperlipidemia, unspecified: Secondary | ICD-10-CM | POA: Diagnosis not present

## 2022-04-28 DIAGNOSIS — Z6828 Body mass index (BMI) 28.0-28.9, adult: Secondary | ICD-10-CM | POA: Diagnosis not present

## 2022-04-28 DIAGNOSIS — E1169 Type 2 diabetes mellitus with other specified complication: Secondary | ICD-10-CM | POA: Diagnosis not present

## 2022-05-11 DIAGNOSIS — E782 Mixed hyperlipidemia: Secondary | ICD-10-CM | POA: Diagnosis not present

## 2022-05-11 DIAGNOSIS — I10 Essential (primary) hypertension: Secondary | ICD-10-CM | POA: Diagnosis not present

## 2022-06-11 DIAGNOSIS — E782 Mixed hyperlipidemia: Secondary | ICD-10-CM | POA: Diagnosis not present

## 2022-06-11 DIAGNOSIS — I10 Essential (primary) hypertension: Secondary | ICD-10-CM | POA: Diagnosis not present

## 2022-07-11 DIAGNOSIS — I10 Essential (primary) hypertension: Secondary | ICD-10-CM | POA: Diagnosis not present

## 2022-07-11 DIAGNOSIS — E782 Mixed hyperlipidemia: Secondary | ICD-10-CM | POA: Diagnosis not present

## 2022-07-15 DIAGNOSIS — Z23 Encounter for immunization: Secondary | ICD-10-CM | POA: Diagnosis not present

## 2022-08-11 DIAGNOSIS — J209 Acute bronchitis, unspecified: Secondary | ICD-10-CM | POA: Diagnosis not present

## 2022-08-11 DIAGNOSIS — Z6827 Body mass index (BMI) 27.0-27.9, adult: Secondary | ICD-10-CM | POA: Diagnosis not present

## 2022-09-09 DIAGNOSIS — E785 Hyperlipidemia, unspecified: Secondary | ICD-10-CM | POA: Diagnosis not present

## 2022-09-09 DIAGNOSIS — R000 Tachycardia, unspecified: Secondary | ICD-10-CM | POA: Diagnosis not present

## 2022-09-09 DIAGNOSIS — I4892 Unspecified atrial flutter: Secondary | ICD-10-CM | POA: Diagnosis not present

## 2022-09-09 DIAGNOSIS — I43 Cardiomyopathy in diseases classified elsewhere: Secondary | ICD-10-CM | POA: Diagnosis not present

## 2022-09-09 DIAGNOSIS — E11620 Type 2 diabetes mellitus with diabetic dermatitis: Secondary | ICD-10-CM | POA: Diagnosis not present

## 2022-09-09 DIAGNOSIS — R002 Palpitations: Secondary | ICD-10-CM | POA: Diagnosis not present

## 2022-09-09 DIAGNOSIS — I10 Essential (primary) hypertension: Secondary | ICD-10-CM | POA: Diagnosis not present

## 2022-09-19 DIAGNOSIS — J209 Acute bronchitis, unspecified: Secondary | ICD-10-CM | POA: Diagnosis not present

## 2022-09-19 DIAGNOSIS — R051 Acute cough: Secondary | ICD-10-CM | POA: Diagnosis not present

## 2022-10-09 ENCOUNTER — Telehealth: Payer: Self-pay

## 2022-10-09 NOTE — Patient Outreach (Signed)
  Care Coordination   Initial Visit Note   10/09/2022 Name: Sisto Granillo MRN: [id number] DOB: 10/13/1950  Junious Ragone is a 71 y.o. year old male who sees Mateo Flow, MD for primary care. I spoke with  [name] by phone today.  What matters to the patients health and wellness today?  Placed call to patient to review and offer [facility] care coordination program.  Patient reports that he is doing well and denies any needs today.    SDOH assessments and interventions completed:  No     Care Coordination Interventions:  No, not indicated   Follow up plan: No further intervention required.   Encounter Outcome:  Pt. Refused   Tomasa Rand, RN, BSN, CEN [facility] [employer] [phone]

## 2022-11-23 DIAGNOSIS — E1169 Type 2 diabetes mellitus with other specified complication: Secondary | ICD-10-CM | POA: Diagnosis not present

## 2022-11-23 DIAGNOSIS — E7800 Pure hypercholesterolemia, unspecified: Secondary | ICD-10-CM | POA: Diagnosis not present

## 2022-11-24 DIAGNOSIS — E785 Hyperlipidemia, unspecified: Secondary | ICD-10-CM | POA: Diagnosis not present

## 2022-11-24 DIAGNOSIS — Z9181 History of falling: Secondary | ICD-10-CM | POA: Diagnosis not present

## 2022-11-24 DIAGNOSIS — E11610 Type 2 diabetes mellitus with diabetic neuropathic arthropathy: Secondary | ICD-10-CM | POA: Diagnosis not present

## 2022-11-24 DIAGNOSIS — E7800 Pure hypercholesterolemia, unspecified: Secondary | ICD-10-CM | POA: Diagnosis not present

## 2022-11-24 DIAGNOSIS — Z0000 Encounter for general adult medical examination without abnormal findings: Secondary | ICD-10-CM | POA: Diagnosis not present

## 2022-11-24 DIAGNOSIS — Z6828 Body mass index (BMI) 28.0-28.9, adult: Secondary | ICD-10-CM | POA: Diagnosis not present

## 2022-11-24 DIAGNOSIS — E1169 Type 2 diabetes mellitus with other specified complication: Secondary | ICD-10-CM | POA: Diagnosis not present

## 2022-11-24 DIAGNOSIS — I4891 Unspecified atrial fibrillation: Secondary | ICD-10-CM | POA: Diagnosis not present

## 2022-12-08 DIAGNOSIS — Z96651 Presence of right artificial knee joint: Secondary | ICD-10-CM | POA: Diagnosis not present

## 2022-12-08 DIAGNOSIS — Z471 Aftercare following joint replacement surgery: Secondary | ICD-10-CM | POA: Diagnosis not present

## 2022-12-22 DIAGNOSIS — Z6827 Body mass index (BMI) 27.0-27.9, adult: Secondary | ICD-10-CM | POA: Diagnosis not present

## 2022-12-22 DIAGNOSIS — J209 Acute bronchitis, unspecified: Secondary | ICD-10-CM | POA: Diagnosis not present

## 2023-01-28 DIAGNOSIS — E1141 Type 2 diabetes mellitus with diabetic mononeuropathy: Secondary | ICD-10-CM | POA: Diagnosis not present

## 2023-01-29 DIAGNOSIS — M720 Palmar fascial fibromatosis [Dupuytren]: Secondary | ICD-10-CM | POA: Diagnosis not present

## 2023-01-29 DIAGNOSIS — E13621 Other specified diabetes mellitus with foot ulcer: Secondary | ICD-10-CM | POA: Diagnosis not present

## 2023-01-29 DIAGNOSIS — E11621 Type 2 diabetes mellitus with foot ulcer: Secondary | ICD-10-CM | POA: Diagnosis not present

## 2023-02-17 DIAGNOSIS — H02839 Dermatochalasis of unspecified eye, unspecified eyelid: Secondary | ICD-10-CM | POA: Diagnosis not present

## 2023-02-17 DIAGNOSIS — H2513 Age-related nuclear cataract, bilateral: Secondary | ICD-10-CM | POA: Diagnosis not present

## 2023-02-17 DIAGNOSIS — Z7984 Long term (current) use of oral hypoglycemic drugs: Secondary | ICD-10-CM | POA: Diagnosis not present

## 2023-02-22 DIAGNOSIS — E13621 Other specified diabetes mellitus with foot ulcer: Secondary | ICD-10-CM | POA: Diagnosis not present

## 2023-03-11 DIAGNOSIS — L89899 Pressure ulcer of other site, unspecified stage: Secondary | ICD-10-CM | POA: Diagnosis not present

## 2023-03-31 DIAGNOSIS — L89899 Pressure ulcer of other site, unspecified stage: Secondary | ICD-10-CM | POA: Diagnosis not present

## 2023-04-19 DIAGNOSIS — L89899 Pressure ulcer of other site, unspecified stage: Secondary | ICD-10-CM | POA: Diagnosis not present

## 2023-05-03 DIAGNOSIS — M19071 Primary osteoarthritis, right ankle and foot: Secondary | ICD-10-CM | POA: Diagnosis not present

## 2023-05-03 DIAGNOSIS — L89899 Pressure ulcer of other site, unspecified stage: Secondary | ICD-10-CM | POA: Diagnosis not present

## 2023-05-11 DIAGNOSIS — M19071 Primary osteoarthritis, right ankle and foot: Secondary | ICD-10-CM | POA: Diagnosis not present

## 2023-05-11 DIAGNOSIS — L89899 Pressure ulcer of other site, unspecified stage: Secondary | ICD-10-CM | POA: Diagnosis not present

## 2023-05-12 DIAGNOSIS — M25571 Pain in right ankle and joints of right foot: Secondary | ICD-10-CM | POA: Diagnosis not present

## 2023-05-14 DIAGNOSIS — E13621 Other specified diabetes mellitus with foot ulcer: Secondary | ICD-10-CM | POA: Diagnosis not present

## 2023-05-14 DIAGNOSIS — L89899 Pressure ulcer of other site, unspecified stage: Secondary | ICD-10-CM | POA: Diagnosis not present

## 2023-05-21 DIAGNOSIS — M14671 Charcot's joint, right ankle and foot: Secondary | ICD-10-CM | POA: Diagnosis not present

## 2023-06-02 DIAGNOSIS — M14671 Charcot's joint, right ankle and foot: Secondary | ICD-10-CM | POA: Diagnosis not present

## 2023-06-18 DIAGNOSIS — M14671 Charcot's joint, right ankle and foot: Secondary | ICD-10-CM | POA: Diagnosis not present

## 2023-06-18 DIAGNOSIS — E11621 Type 2 diabetes mellitus with foot ulcer: Secondary | ICD-10-CM | POA: Diagnosis not present

## 2023-07-02 DIAGNOSIS — E13621 Other specified diabetes mellitus with foot ulcer: Secondary | ICD-10-CM | POA: Diagnosis not present

## 2023-07-02 DIAGNOSIS — M14671 Charcot's joint, right ankle and foot: Secondary | ICD-10-CM | POA: Diagnosis not present

## 2023-07-06 DIAGNOSIS — Z23 Encounter for immunization: Secondary | ICD-10-CM | POA: Diagnosis not present

## 2023-07-21 DIAGNOSIS — M14671 Charcot's joint, right ankle and foot: Secondary | ICD-10-CM | POA: Diagnosis not present

## 2023-07-21 DIAGNOSIS — E13621 Other specified diabetes mellitus with foot ulcer: Secondary | ICD-10-CM | POA: Diagnosis not present

## 2023-08-04 DIAGNOSIS — E13621 Other specified diabetes mellitus with foot ulcer: Secondary | ICD-10-CM | POA: Diagnosis not present

## 2023-08-04 DIAGNOSIS — M14671 Charcot's joint, right ankle and foot: Secondary | ICD-10-CM | POA: Diagnosis not present

## 2023-08-23 DIAGNOSIS — M14671 Charcot's joint, right ankle and foot: Secondary | ICD-10-CM | POA: Diagnosis not present

## 2023-08-23 DIAGNOSIS — E13621 Other specified diabetes mellitus with foot ulcer: Secondary | ICD-10-CM | POA: Diagnosis not present

## 2023-08-27 DIAGNOSIS — R809 Proteinuria, unspecified: Secondary | ICD-10-CM | POA: Diagnosis not present

## 2023-08-27 DIAGNOSIS — E7800 Pure hypercholesterolemia, unspecified: Secondary | ICD-10-CM | POA: Diagnosis not present

## 2023-08-27 DIAGNOSIS — E1129 Type 2 diabetes mellitus with other diabetic kidney complication: Secondary | ICD-10-CM | POA: Diagnosis not present

## 2023-09-07 DIAGNOSIS — Z6827 Body mass index (BMI) 27.0-27.9, adult: Secondary | ICD-10-CM | POA: Diagnosis not present

## 2023-09-07 DIAGNOSIS — I4891 Unspecified atrial fibrillation: Secondary | ICD-10-CM | POA: Diagnosis not present

## 2023-09-07 DIAGNOSIS — E7800 Pure hypercholesterolemia, unspecified: Secondary | ICD-10-CM | POA: Diagnosis not present

## 2023-09-07 DIAGNOSIS — E11610 Type 2 diabetes mellitus with diabetic neuropathic arthropathy: Secondary | ICD-10-CM | POA: Diagnosis not present

## 2023-09-07 DIAGNOSIS — E1169 Type 2 diabetes mellitus with other specified complication: Secondary | ICD-10-CM | POA: Diagnosis not present

## 2023-09-07 DIAGNOSIS — E785 Hyperlipidemia, unspecified: Secondary | ICD-10-CM | POA: Diagnosis not present

## 2023-09-08 DIAGNOSIS — E11628 Type 2 diabetes mellitus with other skin complications: Secondary | ICD-10-CM | POA: Diagnosis not present

## 2023-09-08 DIAGNOSIS — E785 Hyperlipidemia, unspecified: Secondary | ICD-10-CM | POA: Diagnosis not present

## 2023-09-08 DIAGNOSIS — I4892 Unspecified atrial flutter: Secondary | ICD-10-CM | POA: Diagnosis not present

## 2023-09-08 DIAGNOSIS — I10 Essential (primary) hypertension: Secondary | ICD-10-CM | POA: Diagnosis not present

## 2023-09-13 DIAGNOSIS — M14671 Charcot's joint, right ankle and foot: Secondary | ICD-10-CM | POA: Diagnosis not present

## 2023-09-13 DIAGNOSIS — E13621 Other specified diabetes mellitus with foot ulcer: Secondary | ICD-10-CM | POA: Diagnosis not present

## 2023-09-27 DIAGNOSIS — E13621 Other specified diabetes mellitus with foot ulcer: Secondary | ICD-10-CM | POA: Diagnosis not present

## 2023-09-27 DIAGNOSIS — M14671 Charcot's joint, right ankle and foot: Secondary | ICD-10-CM | POA: Diagnosis not present

## 2023-10-20 DIAGNOSIS — M14671 Charcot's joint, right ankle and foot: Secondary | ICD-10-CM | POA: Diagnosis not present

## 2023-10-20 DIAGNOSIS — E13621 Other specified diabetes mellitus with foot ulcer: Secondary | ICD-10-CM | POA: Diagnosis not present

## 2023-11-11 ENCOUNTER — Other Ambulatory Visit: Payer: Self-pay | Admitting: Pharmacist

## 2023-11-11 DIAGNOSIS — Z5986 Financial insecurity: Secondary | ICD-10-CM

## 2023-11-11 NOTE — Progress Notes (Signed)
   11/11/2023  Patient ID: John Miranda, male   DOB: 1950-10-25, 73 y.o.   MRN: [id number]   11/11/2023  John Miranda 1951/03/04 [id number]   2025 Medication Assistance Renewal Application Summary:  Patient was outreached regarding medication assistance renewal for 2025. Verified address, anticipated insurance for 2025, and income has not changed. Patient remains interested in PAP for 2025 for Xigduo through [employer].   The [employer] Program was called and the Representative I spoke with said the Patient needed to be re-enrolled by completing the attestation via telephone. She played a recording with the Patient on conference call and He completed the attestation.  He is now enrolled through 10/11/2024.  The representative also said the Patient needed a new prescription.  A message was sent to Reynold Bowen, PharmD as she is the Advice worker for that practice.  Additional medication assistance options reviewed with patient as warranted:  No other options identified   Medications Reviewed Today     Reviewed by Beecher Mcardle, [facility] (Pharmacist) on 11/11/23 at 1628  Med List Status: <None>   Medication Order Taking? Sig Documenting Provider Last Dose Status Informant  albuterol (PROAIR HFA) 108 (90 Base) MCG/ACT inhaler [id number]  Inhale two puffs every four to six hours as needed for cough or wheeze. Kozlow, Alvira Philips, MD  Active   Dapagliflozin Pro-metFORMIN ER (XIGDUO XR) 02-999 MG TB24 [id number] Yes Take 2 tablets by mouth every morning. [provider] Taking Active   esomeprazole (NEXIUM) 20 MG capsule [id number] Yes Take 20 mg by mouth daily. [provider] Taking Active   glimepiride (AMARYL) 4 MG tablet [id number] Yes Take 1 tablet by mouth every morning. [provider] Taking Active Self           Med Note Kai Levins, MELISSA B   Fri Aug 16, 2015 10:57 AM)    loratadine (CLARITIN) 10 MG tablet [id number] No Take 10 mg by mouth daily. [provider]  Unknown Active   metoprolol succinate (TOPROL-XL) 50 MG 24 hr tablet [id number]  Take 1 tablet (50 mg total) by mouth daily. Take with or immediately following a meal. Revankar, Aundra Dubin, MD  Expired 12/02/20 2359            Med Note Primus Bravo Nov 11, 2023  4:28 PM) Not on med list at PCP office  rosuvastatin (CRESTOR) 5 MG tablet [id number] Yes Take 5 mg by mouth daily. [provider] Taking Active              Plan: Patient has been confirmed to be approved through 10/11/2024 and Reynold Bowen will follow up on the refill.   Thank you for allowing pharmacy to be a part of this patient's care.  Beecher Mcardle, PharmD, BCACP Clinical Pharmacist [phone]         .

## 2023-11-15 DIAGNOSIS — E13621 Other specified diabetes mellitus with foot ulcer: Secondary | ICD-10-CM | POA: Diagnosis not present

## 2023-11-15 DIAGNOSIS — M14671 Charcot's joint, right ankle and foot: Secondary | ICD-10-CM | POA: Diagnosis not present

## 2023-12-01 DIAGNOSIS — E13621 Other specified diabetes mellitus with foot ulcer: Secondary | ICD-10-CM | POA: Diagnosis not present

## 2023-12-01 DIAGNOSIS — M14671 Charcot's joint, right ankle and foot: Secondary | ICD-10-CM | POA: Diagnosis not present

## 2023-12-15 DIAGNOSIS — M14671 Charcot's joint, right ankle and foot: Secondary | ICD-10-CM | POA: Diagnosis not present

## 2023-12-15 DIAGNOSIS — E13621 Other specified diabetes mellitus with foot ulcer: Secondary | ICD-10-CM | POA: Diagnosis not present

## 2023-12-24 DIAGNOSIS — J22 Unspecified acute lower respiratory infection: Secondary | ICD-10-CM | POA: Diagnosis not present

## 2023-12-24 DIAGNOSIS — Z6827 Body mass index (BMI) 27.0-27.9, adult: Secondary | ICD-10-CM | POA: Diagnosis not present

## 2024-01-03 ENCOUNTER — Other Ambulatory Visit: Payer: Self-pay | Admitting: Pharmacist

## 2024-01-03 NOTE — Progress Notes (Signed)
 Care Coordination Call  Received telephone call from patient regarding shipment of Xigduo XR. Connected with [employer], rep needed to confirm mailing address. Verified over the phone. Shipment will process and be to patient within the next 10 to 14 business days. Patient aware.   Reynold Bowen, PharmD Clinical Pharmacist [place] Direct Dial: [phone]

## 2024-01-04 ENCOUNTER — Encounter (HOSPITAL_BASED_OUTPATIENT_CLINIC_OR_DEPARTMENT_OTHER): Payer: PPO | Attending: General Surgery | Admitting: General Surgery

## 2024-01-04 DIAGNOSIS — E11621 Type 2 diabetes mellitus with foot ulcer: Secondary | ICD-10-CM | POA: Insufficient documentation

## 2024-01-04 DIAGNOSIS — E11610 Type 2 diabetes mellitus with diabetic neuropathic arthropathy: Secondary | ICD-10-CM | POA: Diagnosis not present

## 2024-01-04 DIAGNOSIS — L97512 Non-pressure chronic ulcer of other part of right foot with fat layer exposed: Secondary | ICD-10-CM | POA: Insufficient documentation

## 2024-01-06 DIAGNOSIS — E13621 Other specified diabetes mellitus with foot ulcer: Secondary | ICD-10-CM | POA: Diagnosis not present

## 2024-01-06 DIAGNOSIS — M14671 Charcot's joint, right ankle and foot: Secondary | ICD-10-CM | POA: Diagnosis not present

## 2024-01-11 ENCOUNTER — Encounter (HOSPITAL_BASED_OUTPATIENT_CLINIC_OR_DEPARTMENT_OTHER): Attending: General Surgery | Admitting: General Surgery

## 2024-01-11 DIAGNOSIS — M14679 Charcot's joint, unspecified ankle and foot: Secondary | ICD-10-CM | POA: Insufficient documentation

## 2024-01-11 DIAGNOSIS — L97512 Non-pressure chronic ulcer of other part of right foot with fat layer exposed: Secondary | ICD-10-CM | POA: Diagnosis not present

## 2024-01-11 DIAGNOSIS — E11610 Type 2 diabetes mellitus with diabetic neuropathic arthropathy: Secondary | ICD-10-CM | POA: Insufficient documentation

## 2024-01-11 DIAGNOSIS — E11621 Type 2 diabetes mellitus with foot ulcer: Secondary | ICD-10-CM | POA: Insufficient documentation

## 2024-01-18 ENCOUNTER — Encounter (HOSPITAL_BASED_OUTPATIENT_CLINIC_OR_DEPARTMENT_OTHER): Admitting: General Surgery

## 2024-01-18 DIAGNOSIS — E11621 Type 2 diabetes mellitus with foot ulcer: Secondary | ICD-10-CM | POA: Diagnosis not present

## 2024-01-18 DIAGNOSIS — L97512 Non-pressure chronic ulcer of other part of right foot with fat layer exposed: Secondary | ICD-10-CM | POA: Diagnosis not present

## 2024-01-25 ENCOUNTER — Encounter (HOSPITAL_BASED_OUTPATIENT_CLINIC_OR_DEPARTMENT_OTHER): Admitting: General Surgery

## 2024-01-25 DIAGNOSIS — L97512 Non-pressure chronic ulcer of other part of right foot with fat layer exposed: Secondary | ICD-10-CM | POA: Diagnosis not present

## 2024-01-25 DIAGNOSIS — E11621 Type 2 diabetes mellitus with foot ulcer: Secondary | ICD-10-CM | POA: Diagnosis not present

## 2024-02-01 ENCOUNTER — Encounter (HOSPITAL_BASED_OUTPATIENT_CLINIC_OR_DEPARTMENT_OTHER): Admitting: General Surgery

## 2024-02-01 DIAGNOSIS — E11621 Type 2 diabetes mellitus with foot ulcer: Secondary | ICD-10-CM | POA: Diagnosis not present

## 2024-02-01 DIAGNOSIS — L97512 Non-pressure chronic ulcer of other part of right foot with fat layer exposed: Secondary | ICD-10-CM | POA: Diagnosis not present

## 2024-02-08 ENCOUNTER — Encounter (HOSPITAL_BASED_OUTPATIENT_CLINIC_OR_DEPARTMENT_OTHER): Admitting: General Surgery

## 2024-02-08 DIAGNOSIS — L97512 Non-pressure chronic ulcer of other part of right foot with fat layer exposed: Secondary | ICD-10-CM | POA: Diagnosis not present

## 2024-02-08 DIAGNOSIS — E11621 Type 2 diabetes mellitus with foot ulcer: Secondary | ICD-10-CM | POA: Diagnosis not present

## 2024-02-14 DIAGNOSIS — Z6827 Body mass index (BMI) 27.0-27.9, adult: Secondary | ICD-10-CM | POA: Diagnosis not present

## 2024-02-14 DIAGNOSIS — Z9181 History of falling: Secondary | ICD-10-CM | POA: Diagnosis not present

## 2024-02-14 DIAGNOSIS — Z1339 Encounter for screening examination for other mental health and behavioral disorders: Secondary | ICD-10-CM | POA: Diagnosis not present

## 2024-02-14 DIAGNOSIS — Z0000 Encounter for general adult medical examination without abnormal findings: Secondary | ICD-10-CM | POA: Diagnosis not present

## 2024-02-14 DIAGNOSIS — R030 Elevated blood-pressure reading, without diagnosis of hypertension: Secondary | ICD-10-CM | POA: Diagnosis not present

## 2024-02-14 DIAGNOSIS — Z1331 Encounter for screening for depression: Secondary | ICD-10-CM | POA: Diagnosis not present

## 2024-02-15 ENCOUNTER — Encounter (HOSPITAL_BASED_OUTPATIENT_CLINIC_OR_DEPARTMENT_OTHER): Attending: General Surgery | Admitting: General Surgery

## 2024-02-15 DIAGNOSIS — E11610 Type 2 diabetes mellitus with diabetic neuropathic arthropathy: Secondary | ICD-10-CM | POA: Diagnosis not present

## 2024-02-15 DIAGNOSIS — L97512 Non-pressure chronic ulcer of other part of right foot with fat layer exposed: Secondary | ICD-10-CM | POA: Diagnosis not present

## 2024-02-15 DIAGNOSIS — M14679 Charcot's joint, unspecified ankle and foot: Secondary | ICD-10-CM | POA: Diagnosis not present

## 2024-02-15 DIAGNOSIS — E11621 Type 2 diabetes mellitus with foot ulcer: Secondary | ICD-10-CM | POA: Diagnosis not present

## 2024-02-22 ENCOUNTER — Encounter (HOSPITAL_BASED_OUTPATIENT_CLINIC_OR_DEPARTMENT_OTHER): Admitting: Internal Medicine

## 2024-02-22 DIAGNOSIS — L97512 Non-pressure chronic ulcer of other part of right foot with fat layer exposed: Secondary | ICD-10-CM | POA: Diagnosis not present

## 2024-02-22 DIAGNOSIS — E11621 Type 2 diabetes mellitus with foot ulcer: Secondary | ICD-10-CM | POA: Diagnosis not present

## 2024-02-25 LAB — AEROBIC CULTURE W GRAM STAIN (SUPERFICIAL SPECIMEN): Gram Stain: NONE SEEN

## 2024-02-29 ENCOUNTER — Encounter (HOSPITAL_BASED_OUTPATIENT_CLINIC_OR_DEPARTMENT_OTHER): Admitting: General Surgery

## 2024-02-29 DIAGNOSIS — E11621 Type 2 diabetes mellitus with foot ulcer: Secondary | ICD-10-CM | POA: Diagnosis not present

## 2024-02-29 DIAGNOSIS — L97512 Non-pressure chronic ulcer of other part of right foot with fat layer exposed: Secondary | ICD-10-CM | POA: Diagnosis not present

## 2024-03-07 ENCOUNTER — Encounter (HOSPITAL_BASED_OUTPATIENT_CLINIC_OR_DEPARTMENT_OTHER): Admitting: General Surgery

## 2024-03-07 DIAGNOSIS — E11621 Type 2 diabetes mellitus with foot ulcer: Secondary | ICD-10-CM | POA: Diagnosis not present

## 2024-03-07 DIAGNOSIS — L97512 Non-pressure chronic ulcer of other part of right foot with fat layer exposed: Secondary | ICD-10-CM | POA: Diagnosis not present

## 2024-03-14 ENCOUNTER — Encounter (HOSPITAL_BASED_OUTPATIENT_CLINIC_OR_DEPARTMENT_OTHER): Attending: General Surgery | Admitting: General Surgery

## 2024-03-14 DIAGNOSIS — L97512 Non-pressure chronic ulcer of other part of right foot with fat layer exposed: Secondary | ICD-10-CM | POA: Insufficient documentation

## 2024-03-14 DIAGNOSIS — E11621 Type 2 diabetes mellitus with foot ulcer: Secondary | ICD-10-CM | POA: Diagnosis not present

## 2024-03-14 DIAGNOSIS — E11610 Type 2 diabetes mellitus with diabetic neuropathic arthropathy: Secondary | ICD-10-CM | POA: Insufficient documentation

## 2024-03-14 DIAGNOSIS — M14679 Charcot's joint, unspecified ankle and foot: Secondary | ICD-10-CM | POA: Diagnosis not present

## 2024-03-20 DIAGNOSIS — E7800 Pure hypercholesterolemia, unspecified: Secondary | ICD-10-CM | POA: Diagnosis not present

## 2024-03-20 DIAGNOSIS — Z79899 Other long term (current) drug therapy: Secondary | ICD-10-CM | POA: Diagnosis not present

## 2024-03-20 DIAGNOSIS — E1169 Type 2 diabetes mellitus with other specified complication: Secondary | ICD-10-CM | POA: Diagnosis not present

## 2024-03-20 DIAGNOSIS — Z125 Encounter for screening for malignant neoplasm of prostate: Secondary | ICD-10-CM | POA: Diagnosis not present

## 2024-03-21 ENCOUNTER — Encounter (HOSPITAL_BASED_OUTPATIENT_CLINIC_OR_DEPARTMENT_OTHER): Admitting: General Surgery

## 2024-03-21 DIAGNOSIS — L97512 Non-pressure chronic ulcer of other part of right foot with fat layer exposed: Secondary | ICD-10-CM | POA: Diagnosis not present

## 2024-03-21 DIAGNOSIS — E11621 Type 2 diabetes mellitus with foot ulcer: Secondary | ICD-10-CM | POA: Diagnosis not present

## 2024-03-22 DIAGNOSIS — E785 Hyperlipidemia, unspecified: Secondary | ICD-10-CM | POA: Diagnosis not present

## 2024-03-22 DIAGNOSIS — I4891 Unspecified atrial fibrillation: Secondary | ICD-10-CM | POA: Diagnosis not present

## 2024-03-22 DIAGNOSIS — Z6827 Body mass index (BMI) 27.0-27.9, adult: Secondary | ICD-10-CM | POA: Diagnosis not present

## 2024-03-22 DIAGNOSIS — E11610 Type 2 diabetes mellitus with diabetic neuropathic arthropathy: Secondary | ICD-10-CM | POA: Diagnosis not present

## 2024-03-22 DIAGNOSIS — E1169 Type 2 diabetes mellitus with other specified complication: Secondary | ICD-10-CM | POA: Diagnosis not present

## 2024-03-22 DIAGNOSIS — E7800 Pure hypercholesterolemia, unspecified: Secondary | ICD-10-CM | POA: Diagnosis not present

## 2024-03-28 ENCOUNTER — Encounter (HOSPITAL_BASED_OUTPATIENT_CLINIC_OR_DEPARTMENT_OTHER): Admitting: General Surgery

## 2024-03-28 DIAGNOSIS — E11621 Type 2 diabetes mellitus with foot ulcer: Secondary | ICD-10-CM | POA: Diagnosis not present

## 2024-03-28 DIAGNOSIS — L97512 Non-pressure chronic ulcer of other part of right foot with fat layer exposed: Secondary | ICD-10-CM | POA: Diagnosis not present

## 2024-04-04 ENCOUNTER — Encounter (HOSPITAL_BASED_OUTPATIENT_CLINIC_OR_DEPARTMENT_OTHER): Admitting: General Surgery

## 2024-04-04 DIAGNOSIS — E11621 Type 2 diabetes mellitus with foot ulcer: Secondary | ICD-10-CM | POA: Diagnosis not present

## 2024-04-04 DIAGNOSIS — L97512 Non-pressure chronic ulcer of other part of right foot with fat layer exposed: Secondary | ICD-10-CM | POA: Diagnosis not present

## 2024-04-11 ENCOUNTER — Encounter (HOSPITAL_BASED_OUTPATIENT_CLINIC_OR_DEPARTMENT_OTHER): Attending: General Surgery | Admitting: General Surgery

## 2024-04-11 DIAGNOSIS — E11610 Type 2 diabetes mellitus with diabetic neuropathic arthropathy: Secondary | ICD-10-CM | POA: Diagnosis not present

## 2024-04-11 DIAGNOSIS — L97512 Non-pressure chronic ulcer of other part of right foot with fat layer exposed: Secondary | ICD-10-CM | POA: Insufficient documentation

## 2024-04-11 DIAGNOSIS — E11621 Type 2 diabetes mellitus with foot ulcer: Secondary | ICD-10-CM | POA: Diagnosis not present

## 2024-04-18 ENCOUNTER — Encounter (HOSPITAL_BASED_OUTPATIENT_CLINIC_OR_DEPARTMENT_OTHER): Admitting: General Surgery

## 2024-04-18 DIAGNOSIS — L97512 Non-pressure chronic ulcer of other part of right foot with fat layer exposed: Secondary | ICD-10-CM | POA: Diagnosis not present

## 2024-04-18 DIAGNOSIS — E11621 Type 2 diabetes mellitus with foot ulcer: Secondary | ICD-10-CM | POA: Diagnosis not present

## 2024-04-25 ENCOUNTER — Encounter (HOSPITAL_BASED_OUTPATIENT_CLINIC_OR_DEPARTMENT_OTHER): Admitting: General Surgery

## 2024-04-25 DIAGNOSIS — E11621 Type 2 diabetes mellitus with foot ulcer: Secondary | ICD-10-CM | POA: Diagnosis not present

## 2024-04-25 DIAGNOSIS — L97512 Non-pressure chronic ulcer of other part of right foot with fat layer exposed: Secondary | ICD-10-CM | POA: Diagnosis not present

## 2024-05-02 ENCOUNTER — Encounter (HOSPITAL_BASED_OUTPATIENT_CLINIC_OR_DEPARTMENT_OTHER): Admitting: General Surgery

## 2024-05-02 DIAGNOSIS — L97512 Non-pressure chronic ulcer of other part of right foot with fat layer exposed: Secondary | ICD-10-CM | POA: Diagnosis not present

## 2024-05-02 DIAGNOSIS — E11621 Type 2 diabetes mellitus with foot ulcer: Secondary | ICD-10-CM | POA: Diagnosis not present

## 2024-05-09 ENCOUNTER — Encounter (HOSPITAL_BASED_OUTPATIENT_CLINIC_OR_DEPARTMENT_OTHER): Admitting: General Surgery

## 2024-05-09 DIAGNOSIS — L97512 Non-pressure chronic ulcer of other part of right foot with fat layer exposed: Secondary | ICD-10-CM | POA: Diagnosis not present

## 2024-05-09 DIAGNOSIS — E11621 Type 2 diabetes mellitus with foot ulcer: Secondary | ICD-10-CM | POA: Diagnosis not present

## 2024-05-10 DIAGNOSIS — L97512 Non-pressure chronic ulcer of other part of right foot with fat layer exposed: Secondary | ICD-10-CM | POA: Diagnosis not present

## 2024-05-16 ENCOUNTER — Encounter (HOSPITAL_BASED_OUTPATIENT_CLINIC_OR_DEPARTMENT_OTHER): Attending: General Surgery | Admitting: General Surgery

## 2024-05-16 DIAGNOSIS — L97512 Non-pressure chronic ulcer of other part of right foot with fat layer exposed: Secondary | ICD-10-CM | POA: Insufficient documentation

## 2024-05-16 DIAGNOSIS — M14679 Charcot's joint, unspecified ankle and foot: Secondary | ICD-10-CM | POA: Insufficient documentation

## 2024-05-16 DIAGNOSIS — E11621 Type 2 diabetes mellitus with foot ulcer: Secondary | ICD-10-CM | POA: Diagnosis not present

## 2024-05-23 ENCOUNTER — Encounter (HOSPITAL_BASED_OUTPATIENT_CLINIC_OR_DEPARTMENT_OTHER): Admitting: General Surgery

## 2024-05-23 DIAGNOSIS — L97512 Non-pressure chronic ulcer of other part of right foot with fat layer exposed: Secondary | ICD-10-CM | POA: Diagnosis not present

## 2024-05-23 DIAGNOSIS — E11621 Type 2 diabetes mellitus with foot ulcer: Secondary | ICD-10-CM | POA: Diagnosis not present

## 2024-05-30 ENCOUNTER — Encounter (HOSPITAL_BASED_OUTPATIENT_CLINIC_OR_DEPARTMENT_OTHER): Admitting: General Surgery

## 2024-05-30 DIAGNOSIS — L97512 Non-pressure chronic ulcer of other part of right foot with fat layer exposed: Secondary | ICD-10-CM | POA: Diagnosis not present

## 2024-05-30 DIAGNOSIS — E11621 Type 2 diabetes mellitus with foot ulcer: Secondary | ICD-10-CM | POA: Diagnosis not present

## 2024-06-06 ENCOUNTER — Encounter (HOSPITAL_BASED_OUTPATIENT_CLINIC_OR_DEPARTMENT_OTHER): Admitting: General Surgery

## 2024-06-06 DIAGNOSIS — E11621 Type 2 diabetes mellitus with foot ulcer: Secondary | ICD-10-CM | POA: Diagnosis not present

## 2024-06-06 DIAGNOSIS — L97512 Non-pressure chronic ulcer of other part of right foot with fat layer exposed: Secondary | ICD-10-CM | POA: Diagnosis not present

## 2024-06-13 ENCOUNTER — Encounter (HOSPITAL_BASED_OUTPATIENT_CLINIC_OR_DEPARTMENT_OTHER): Admitting: General Surgery

## 2024-06-14 ENCOUNTER — Encounter (HOSPITAL_BASED_OUTPATIENT_CLINIC_OR_DEPARTMENT_OTHER): Attending: General Surgery | Admitting: General Surgery

## 2024-06-14 DIAGNOSIS — L97512 Non-pressure chronic ulcer of other part of right foot with fat layer exposed: Secondary | ICD-10-CM | POA: Diagnosis not present

## 2024-06-14 DIAGNOSIS — E11621 Type 2 diabetes mellitus with foot ulcer: Secondary | ICD-10-CM | POA: Insufficient documentation

## 2024-06-14 DIAGNOSIS — E11610 Type 2 diabetes mellitus with diabetic neuropathic arthropathy: Secondary | ICD-10-CM | POA: Insufficient documentation

## 2024-06-20 ENCOUNTER — Encounter (HOSPITAL_BASED_OUTPATIENT_CLINIC_OR_DEPARTMENT_OTHER): Admitting: General Surgery

## 2024-06-20 DIAGNOSIS — L97512 Non-pressure chronic ulcer of other part of right foot with fat layer exposed: Secondary | ICD-10-CM | POA: Diagnosis not present

## 2024-06-20 DIAGNOSIS — E11621 Type 2 diabetes mellitus with foot ulcer: Secondary | ICD-10-CM | POA: Diagnosis not present

## 2024-06-27 ENCOUNTER — Encounter (HOSPITAL_BASED_OUTPATIENT_CLINIC_OR_DEPARTMENT_OTHER): Admitting: General Surgery

## 2024-06-27 DIAGNOSIS — L97512 Non-pressure chronic ulcer of other part of right foot with fat layer exposed: Secondary | ICD-10-CM | POA: Diagnosis not present

## 2024-06-27 DIAGNOSIS — E11621 Type 2 diabetes mellitus with foot ulcer: Secondary | ICD-10-CM | POA: Diagnosis not present

## 2024-07-04 ENCOUNTER — Encounter (HOSPITAL_BASED_OUTPATIENT_CLINIC_OR_DEPARTMENT_OTHER): Admitting: General Surgery

## 2024-07-04 DIAGNOSIS — L97512 Non-pressure chronic ulcer of other part of right foot with fat layer exposed: Secondary | ICD-10-CM | POA: Diagnosis not present

## 2024-07-04 DIAGNOSIS — E11621 Type 2 diabetes mellitus with foot ulcer: Secondary | ICD-10-CM | POA: Diagnosis not present

## 2024-07-11 ENCOUNTER — Encounter (HOSPITAL_BASED_OUTPATIENT_CLINIC_OR_DEPARTMENT_OTHER): Admitting: Internal Medicine

## 2024-07-11 DIAGNOSIS — E11621 Type 2 diabetes mellitus with foot ulcer: Secondary | ICD-10-CM | POA: Diagnosis not present

## 2024-07-11 DIAGNOSIS — L97512 Non-pressure chronic ulcer of other part of right foot with fat layer exposed: Secondary | ICD-10-CM | POA: Diagnosis not present

## 2024-07-17 ENCOUNTER — Telehealth: Payer: Self-pay

## 2024-07-17 NOTE — Telephone Encounter (Signed)
 Received completed patient portion of patient assistance application for Xigduo.  Faxed provider portion to Dr. Tracey Bathe at [facility]

## 2024-07-18 ENCOUNTER — Encounter (HOSPITAL_BASED_OUTPATIENT_CLINIC_OR_DEPARTMENT_OTHER): Attending: General Surgery | Admitting: General Surgery

## 2024-07-18 DIAGNOSIS — E11621 Type 2 diabetes mellitus with foot ulcer: Secondary | ICD-10-CM | POA: Diagnosis not present

## 2024-07-18 DIAGNOSIS — E11610 Type 2 diabetes mellitus with diabetic neuropathic arthropathy: Secondary | ICD-10-CM | POA: Diagnosis not present

## 2024-07-18 DIAGNOSIS — L97512 Non-pressure chronic ulcer of other part of right foot with fat layer exposed: Secondary | ICD-10-CM | POA: Insufficient documentation

## 2024-07-18 DIAGNOSIS — M14679 Charcot's joint, unspecified ankle and foot: Secondary | ICD-10-CM | POA: Insufficient documentation

## 2024-07-18 NOTE — Telephone Encounter (Signed)
 PAP: Application for John Miranda has been submitted to [employer] ([employer]), via fax

## 2024-07-19 NOTE — Telephone Encounter (Signed)
 PAP: Patient assistance application for Nan has been approved by PAP Companies: [employer] from 10/12/2024 to 10/11/2025. Medication should be delivered to PAP Delivery: Home. For further shipping updates, please contact [employer] ([employer]) at [phone]. Patient ID is: [id number]

## 2024-07-24 DIAGNOSIS — L97512 Non-pressure chronic ulcer of other part of right foot with fat layer exposed: Secondary | ICD-10-CM | POA: Diagnosis not present

## 2024-07-25 ENCOUNTER — Encounter (HOSPITAL_BASED_OUTPATIENT_CLINIC_OR_DEPARTMENT_OTHER): Admitting: General Surgery

## 2024-07-25 DIAGNOSIS — L97512 Non-pressure chronic ulcer of other part of right foot with fat layer exposed: Secondary | ICD-10-CM | POA: Diagnosis not present

## 2024-07-25 DIAGNOSIS — E11621 Type 2 diabetes mellitus with foot ulcer: Secondary | ICD-10-CM | POA: Diagnosis not present

## 2024-07-31 DIAGNOSIS — M720 Palmar fascial fibromatosis [Dupuytren]: Secondary | ICD-10-CM | POA: Diagnosis not present

## 2024-08-01 ENCOUNTER — Encounter (HOSPITAL_BASED_OUTPATIENT_CLINIC_OR_DEPARTMENT_OTHER): Admitting: General Surgery

## 2024-08-01 DIAGNOSIS — E11621 Type 2 diabetes mellitus with foot ulcer: Secondary | ICD-10-CM | POA: Diagnosis not present

## 2024-08-01 DIAGNOSIS — L97512 Non-pressure chronic ulcer of other part of right foot with fat layer exposed: Secondary | ICD-10-CM | POA: Diagnosis not present

## 2024-08-03 DIAGNOSIS — Z79899 Other long term (current) drug therapy: Secondary | ICD-10-CM | POA: Diagnosis not present

## 2024-08-03 DIAGNOSIS — Z6827 Body mass index (BMI) 27.0-27.9, adult: Secondary | ICD-10-CM | POA: Diagnosis not present

## 2024-08-03 DIAGNOSIS — I4892 Unspecified atrial flutter: Secondary | ICD-10-CM | POA: Diagnosis not present

## 2024-08-03 DIAGNOSIS — L97519 Non-pressure chronic ulcer of other part of right foot with unspecified severity: Secondary | ICD-10-CM | POA: Diagnosis not present

## 2024-08-03 DIAGNOSIS — E1169 Type 2 diabetes mellitus with other specified complication: Secondary | ICD-10-CM | POA: Diagnosis not present

## 2024-08-03 DIAGNOSIS — E785 Hyperlipidemia, unspecified: Secondary | ICD-10-CM | POA: Diagnosis not present

## 2024-08-08 ENCOUNTER — Encounter (HOSPITAL_BASED_OUTPATIENT_CLINIC_OR_DEPARTMENT_OTHER): Admitting: General Surgery

## 2024-08-08 DIAGNOSIS — L97512 Non-pressure chronic ulcer of other part of right foot with fat layer exposed: Secondary | ICD-10-CM | POA: Diagnosis not present

## 2024-08-08 DIAGNOSIS — E11621 Type 2 diabetes mellitus with foot ulcer: Secondary | ICD-10-CM | POA: Diagnosis not present

## 2024-08-11 ENCOUNTER — Encounter (HOSPITAL_BASED_OUTPATIENT_CLINIC_OR_DEPARTMENT_OTHER): Admitting: General Surgery

## 2024-08-11 DIAGNOSIS — E11621 Type 2 diabetes mellitus with foot ulcer: Secondary | ICD-10-CM | POA: Diagnosis not present

## 2024-08-11 DIAGNOSIS — L97512 Non-pressure chronic ulcer of other part of right foot with fat layer exposed: Secondary | ICD-10-CM | POA: Diagnosis not present

## 2024-08-15 ENCOUNTER — Encounter (HOSPITAL_BASED_OUTPATIENT_CLINIC_OR_DEPARTMENT_OTHER): Attending: General Surgery | Admitting: General Surgery

## 2024-08-15 DIAGNOSIS — E11610 Type 2 diabetes mellitus with diabetic neuropathic arthropathy: Secondary | ICD-10-CM | POA: Diagnosis not present

## 2024-08-15 DIAGNOSIS — E11621 Type 2 diabetes mellitus with foot ulcer: Secondary | ICD-10-CM | POA: Diagnosis not present

## 2024-08-15 DIAGNOSIS — L97512 Non-pressure chronic ulcer of other part of right foot with fat layer exposed: Secondary | ICD-10-CM | POA: Insufficient documentation

## 2024-08-17 DIAGNOSIS — M25512 Pain in left shoulder: Secondary | ICD-10-CM | POA: Diagnosis not present

## 2024-08-22 ENCOUNTER — Encounter (HOSPITAL_BASED_OUTPATIENT_CLINIC_OR_DEPARTMENT_OTHER): Admitting: General Surgery

## 2024-08-22 DIAGNOSIS — L97512 Non-pressure chronic ulcer of other part of right foot with fat layer exposed: Secondary | ICD-10-CM | POA: Diagnosis not present

## 2024-08-22 DIAGNOSIS — E11621 Type 2 diabetes mellitus with foot ulcer: Secondary | ICD-10-CM | POA: Diagnosis not present

## 2024-08-22 DIAGNOSIS — B966 Bacteroides fragilis [B. fragilis] as the cause of diseases classified elsewhere: Secondary | ICD-10-CM | POA: Diagnosis not present

## 2024-08-29 ENCOUNTER — Encounter (HOSPITAL_BASED_OUTPATIENT_CLINIC_OR_DEPARTMENT_OTHER): Admitting: General Surgery

## 2024-08-29 DIAGNOSIS — L97512 Non-pressure chronic ulcer of other part of right foot with fat layer exposed: Secondary | ICD-10-CM | POA: Diagnosis not present

## 2024-08-29 DIAGNOSIS — E11621 Type 2 diabetes mellitus with foot ulcer: Secondary | ICD-10-CM | POA: Diagnosis not present

## 2024-09-04 ENCOUNTER — Encounter (HOSPITAL_BASED_OUTPATIENT_CLINIC_OR_DEPARTMENT_OTHER): Admitting: General Surgery

## 2024-09-04 DIAGNOSIS — E11621 Type 2 diabetes mellitus with foot ulcer: Secondary | ICD-10-CM | POA: Diagnosis not present

## 2024-09-04 DIAGNOSIS — L97512 Non-pressure chronic ulcer of other part of right foot with fat layer exposed: Secondary | ICD-10-CM | POA: Diagnosis not present

## 2024-09-05 ENCOUNTER — Encounter (HOSPITAL_BASED_OUTPATIENT_CLINIC_OR_DEPARTMENT_OTHER): Admitting: General Surgery

## 2024-09-05 DIAGNOSIS — Z8639 Personal history of other endocrine, nutritional and metabolic disease: Secondary | ICD-10-CM | POA: Diagnosis not present

## 2024-09-05 DIAGNOSIS — M720 Palmar fascial fibromatosis [Dupuytren]: Secondary | ICD-10-CM | POA: Diagnosis not present

## 2024-09-05 DIAGNOSIS — G8918 Other acute postprocedural pain: Secondary | ICD-10-CM | POA: Diagnosis not present

## 2024-09-06 DIAGNOSIS — Z96652 Presence of left artificial knee joint: Secondary | ICD-10-CM | POA: Diagnosis not present

## 2024-09-06 DIAGNOSIS — I10 Essential (primary) hypertension: Secondary | ICD-10-CM | POA: Diagnosis not present

## 2024-09-06 DIAGNOSIS — E11628 Type 2 diabetes mellitus with other skin complications: Secondary | ICD-10-CM | POA: Diagnosis not present

## 2024-09-06 DIAGNOSIS — E785 Hyperlipidemia, unspecified: Secondary | ICD-10-CM | POA: Diagnosis not present

## 2024-09-06 DIAGNOSIS — I428 Other cardiomyopathies: Secondary | ICD-10-CM | POA: Diagnosis not present

## 2024-09-06 DIAGNOSIS — I4892 Unspecified atrial flutter: Secondary | ICD-10-CM | POA: Diagnosis not present

## 2024-09-12 ENCOUNTER — Encounter (HOSPITAL_BASED_OUTPATIENT_CLINIC_OR_DEPARTMENT_OTHER): Attending: General Surgery | Admitting: General Surgery

## 2024-09-12 DIAGNOSIS — M14679 Charcot's joint, unspecified ankle and foot: Secondary | ICD-10-CM | POA: Diagnosis not present

## 2024-09-12 DIAGNOSIS — E11621 Type 2 diabetes mellitus with foot ulcer: Secondary | ICD-10-CM | POA: Diagnosis not present

## 2024-09-12 DIAGNOSIS — E11610 Type 2 diabetes mellitus with diabetic neuropathic arthropathy: Secondary | ICD-10-CM | POA: Diagnosis not present

## 2024-09-12 DIAGNOSIS — L97512 Non-pressure chronic ulcer of other part of right foot with fat layer exposed: Secondary | ICD-10-CM | POA: Diagnosis not present

## 2024-09-13 DIAGNOSIS — M79642 Pain in left hand: Secondary | ICD-10-CM | POA: Diagnosis not present

## 2024-09-18 DIAGNOSIS — L97415 Non-pressure chronic ulcer of right heel and midfoot with muscle involvement without evidence of necrosis: Secondary | ICD-10-CM | POA: Diagnosis not present

## 2024-09-18 DIAGNOSIS — E11621 Type 2 diabetes mellitus with foot ulcer: Secondary | ICD-10-CM | POA: Diagnosis not present

## 2024-09-18 DIAGNOSIS — M14671 Charcot's joint, right ankle and foot: Secondary | ICD-10-CM | POA: Diagnosis not present

## 2024-09-18 DIAGNOSIS — M79671 Pain in right foot: Secondary | ICD-10-CM | POA: Diagnosis not present

## 2024-09-19 ENCOUNTER — Encounter (HOSPITAL_BASED_OUTPATIENT_CLINIC_OR_DEPARTMENT_OTHER): Admitting: Internal Medicine

## 2024-09-19 DIAGNOSIS — L97512 Non-pressure chronic ulcer of other part of right foot with fat layer exposed: Secondary | ICD-10-CM | POA: Diagnosis not present

## 2024-09-19 DIAGNOSIS — M14679 Charcot's joint, unspecified ankle and foot: Secondary | ICD-10-CM | POA: Diagnosis not present

## 2024-09-19 DIAGNOSIS — E11621 Type 2 diabetes mellitus with foot ulcer: Secondary | ICD-10-CM | POA: Diagnosis not present

## 2024-09-20 DIAGNOSIS — M79642 Pain in left hand: Secondary | ICD-10-CM | POA: Diagnosis not present

## 2024-09-26 ENCOUNTER — Encounter (HOSPITAL_BASED_OUTPATIENT_CLINIC_OR_DEPARTMENT_OTHER): Admitting: General Surgery

## 2024-09-26 DIAGNOSIS — E11621 Type 2 diabetes mellitus with foot ulcer: Secondary | ICD-10-CM | POA: Diagnosis not present

## 2024-09-28 ENCOUNTER — Encounter: Payer: Self-pay | Admitting: *Deleted

## 2024-09-28 NOTE — Progress Notes (Signed)
 John Miranda                                          MRN: [id number]   09/28/2024   The VBCI Quality Team Specialist reviewed this patient medical record for the purposes of chart review for care gap closure. The following were reviewed: chart review for care gap closure-kidney health evaluation for diabetes:eGFR  and uACR.    VBCI Quality Team

## 2024-10-03 ENCOUNTER — Encounter (HOSPITAL_BASED_OUTPATIENT_CLINIC_OR_DEPARTMENT_OTHER): Admitting: General Surgery

## 2024-10-03 DIAGNOSIS — E11621 Type 2 diabetes mellitus with foot ulcer: Secondary | ICD-10-CM | POA: Diagnosis not present

## 2024-10-10 ENCOUNTER — Encounter (HOSPITAL_BASED_OUTPATIENT_CLINIC_OR_DEPARTMENT_OTHER): Admitting: General Surgery

## 2024-10-10 DIAGNOSIS — E11621 Type 2 diabetes mellitus with foot ulcer: Secondary | ICD-10-CM | POA: Diagnosis not present

## 2024-10-17 ENCOUNTER — Encounter (HOSPITAL_BASED_OUTPATIENT_CLINIC_OR_DEPARTMENT_OTHER): Attending: General Surgery | Admitting: General Surgery

## 2024-10-17 DIAGNOSIS — M14679 Charcot's joint, unspecified ankle and foot: Secondary | ICD-10-CM | POA: Insufficient documentation

## 2024-10-17 DIAGNOSIS — E11610 Type 2 diabetes mellitus with diabetic neuropathic arthropathy: Secondary | ICD-10-CM | POA: Diagnosis not present

## 2024-10-17 DIAGNOSIS — L97512 Non-pressure chronic ulcer of other part of right foot with fat layer exposed: Secondary | ICD-10-CM | POA: Diagnosis not present

## 2024-10-17 DIAGNOSIS — E11621 Type 2 diabetes mellitus with foot ulcer: Secondary | ICD-10-CM | POA: Insufficient documentation

## 2024-10-17 NOTE — Progress Notes (Signed)
 John Miranda                                          MRN: [id number]   10/17/2024   The VBCI Quality Team Specialist reviewed this patient medical record for the purposes of chart review for care gap closure. The following were reviewed: chart review for care gap closure-controlling blood pressure.    VBCI Quality Team

## 2024-10-24 ENCOUNTER — Encounter (HOSPITAL_BASED_OUTPATIENT_CLINIC_OR_DEPARTMENT_OTHER): Admitting: General Surgery

## 2024-10-24 DIAGNOSIS — E11621 Type 2 diabetes mellitus with foot ulcer: Secondary | ICD-10-CM | POA: Diagnosis not present

## 2024-10-31 ENCOUNTER — Encounter (HOSPITAL_BASED_OUTPATIENT_CLINIC_OR_DEPARTMENT_OTHER): Admitting: General Surgery

## 2024-10-31 DIAGNOSIS — E11621 Type 2 diabetes mellitus with foot ulcer: Secondary | ICD-10-CM | POA: Diagnosis not present

## 2024-11-07 ENCOUNTER — Encounter (HOSPITAL_BASED_OUTPATIENT_CLINIC_OR_DEPARTMENT_OTHER): Admitting: General Surgery

## 2024-11-07 DIAGNOSIS — E11621 Type 2 diabetes mellitus with foot ulcer: Secondary | ICD-10-CM | POA: Diagnosis not present

## 2024-11-14 ENCOUNTER — Encounter (HOSPITAL_BASED_OUTPATIENT_CLINIC_OR_DEPARTMENT_OTHER): Admitting: General Surgery

## 2024-11-21 ENCOUNTER — Encounter (HOSPITAL_BASED_OUTPATIENT_CLINIC_OR_DEPARTMENT_OTHER): Admitting: General Surgery

## 2024-11-28 ENCOUNTER — Encounter (HOSPITAL_BASED_OUTPATIENT_CLINIC_OR_DEPARTMENT_OTHER): Admitting: General Surgery

## 2024-12-05 ENCOUNTER — Encounter (HOSPITAL_BASED_OUTPATIENT_CLINIC_OR_DEPARTMENT_OTHER): Admitting: General Surgery
